# Patient Record
Sex: Female | Born: 1980 | Race: Black or African American | Hispanic: No | Marital: Single | Smoking: Former smoker
Health system: Southern US, Community
[De-identification: ages and names within clinical notes are randomized; demographics above are authoritative.]

## PROBLEM LIST (undated history)

## (undated) DIAGNOSIS — Z8669 Personal history of other diseases of the nervous system and sense organs: Secondary | ICD-10-CM

## (undated) DIAGNOSIS — Z87442 Personal history of urinary calculi: Secondary | ICD-10-CM

## (undated) HISTORY — DX: Personal history of other diseases of the nervous system and sense organs: Z86.69

---

## 2004-07-27 HISTORY — PX: TUBAL LIGATION: SHX77

## 2005-07-27 HISTORY — PX: CHOLECYSTECTOMY: SHX55

## 2011-02-22 ENCOUNTER — Emergency Department (HOSPITAL_COMMUNITY)
Admission: EM | Admit: 2011-02-22 | Discharge: 2011-02-22 | Disposition: A | Discharge status: Home / Self Care | Payer: Self-pay | Attending: Emergency Medicine | Admitting: Emergency Medicine

## 2011-02-22 DIAGNOSIS — H9209 Otalgia, unspecified ear: Secondary | ICD-10-CM | POA: Insufficient documentation

## 2011-02-22 DIAGNOSIS — J029 Acute pharyngitis, unspecified: Secondary | ICD-10-CM | POA: Insufficient documentation

## 2011-02-22 LAB — RAPID STREP SCREEN (MED CTR MEBANE ONLY): Streptococcus, Group A Screen (Direct): NEGATIVE

## 2011-09-18 ENCOUNTER — Emergency Department (INDEPENDENT_AMBULATORY_CARE_PROVIDER_SITE_OTHER)
Admission: EM | Admit: 2011-09-18 | Discharge: 2011-09-18 | Disposition: A | Discharge status: Home / Self Care | Payer: Self-pay | Source: Home / Self Care | Attending: Emergency Medicine | Admitting: Emergency Medicine

## 2011-09-18 ENCOUNTER — Encounter (HOSPITAL_COMMUNITY): Payer: Self-pay

## 2011-09-18 DIAGNOSIS — J111 Influenza due to unidentified influenza virus with other respiratory manifestations: Secondary | ICD-10-CM

## 2011-09-18 DIAGNOSIS — R6889 Other general symptoms and signs: Secondary | ICD-10-CM

## 2011-09-18 HISTORY — DX: Personal history of other diseases of the nervous system and sense organs: Z86.69

## 2011-09-18 MED ORDER — IBUPROFEN 600 MG PO TABS: 600.0000 mg | ORAL_TABLET | Freq: Four times a day (QID) | ORAL | Status: AC | PRN

## 2011-09-18 MED ORDER — FLUTICASONE PROPIONATE 50 MCG/ACT NA SUSP: 2.0000 | Freq: Every day | NASAL | Status: DC

## 2011-09-18 MED ORDER — HYDROCODONE-HOMATROPINE 5-1.5 MG/5ML PO SYRP: 5.0000 mL | ORAL_SOLUTION | Freq: Four times a day (QID) | ORAL | Status: AC | PRN

## 2011-09-18 MED ORDER — PSEUDOEPHEDRINE-GUAIFENESIN ER 120-1200 MG PO TB12: 1.0000 | ORAL_TABLET | Freq: Two times a day (BID) | ORAL | Status: DC | PRN

## 2011-09-18 NOTE — ED Provider Notes (Signed)
History     CSN: [id number]  Arrival date & time 09/18/11  1351   First MD Initiated Contact with Patient 09/18/11 1532      Chief Complaint  Patient presents with  . Influenza    (Consider location/radiation/quality/duration/timing/severity/associated sxs/prior treatment) HPI Comments: Pt with rhinorrhea, postnasal drip, ST, nonproductive cough, fatigue. bodyaches starting last night. States feels "hot" but no measured fevers at home. No ear pain, abd pain, wheeze, SOB, abd pain, rash, N/V. Slightly decreased appetite but is tolerating po.  No urinary complaints. Patient is a Engineer, civil (consulting) at [employer],  And got her flu shot this year. Has multiple sick contacts with similar symptoms.      Patient is a 31 y.o. female presenting with URI. The history is provided by the patient.  URI The primary symptoms include fatigue, headaches, sore throat, cough, nausea and myalgias. Primary symptoms do not include fever, wheezing, abdominal pain, vomiting or rash. The current episode started yesterday. This is a new problem. The problem has not changed since onset.   Past Medical History  Diagnosis Date  . Hx of migraine headaches     History reviewed. No pertinent past surgical history.  History reviewed. No pertinent family history.  History  Substance Use Topics  . Smoking status: Never Smoker   . Smokeless tobacco: Not on file  . Alcohol Use: Yes     occasional    OB History    Grav Para Term Preterm Abortions TAB SAB Ect Mult Living                  Review of Systems  Constitutional: Positive for fatigue. Negative for fever.  HENT: Positive for sore throat.   Respiratory: Positive for cough. Negative for wheezing.   Gastrointestinal: Positive for nausea. Negative for vomiting and abdominal pain.  Musculoskeletal: Positive for myalgias.  Skin: Negative for rash.  Neurological: Positive for headaches.    Allergies  Review of patient's allergies indicates no known  allergies.  Home Medications   Current Outpatient Rx  Name Route Sig Dispense Refill  . FLUTICASONE PROPIONATE 50 MCG/ACT NA SUSP Nasal Place 2 sprays into the nose daily. 16 g 0  . HYDROCODONE-HOMATROPINE 5-1.5 MG/5ML PO SYRP Oral Take 5 mLs by mouth every 6 (six) hours as needed for cough or pain. 120 mL 0  . IBUPROFEN 600 MG PO TABS Oral Take 1 tablet (600 mg total) by mouth every 6 (six) hours as needed for pain. 30 tablet 0  . PSEUDOEPHEDRINE-GUAIFENESIN ER [phone] MG PO TB12 Oral Take 1 tablet by mouth 2 (two) times daily as needed (congestion). 20 each 0    BP 130/78  Pulse 96  Temp(Src) 98.4 F (36.9 C) (Oral)  Resp 18  SpO2 100%  LMP 08/18/2011  Physical Exam  Nursing note and vitals reviewed. Constitutional: She is oriented to person, place, and time. She appears well-developed and well-nourished.  HENT:  Head: Normocephalic and atraumatic.  Right Ear: Tympanic membrane and ear canal normal.  Left Ear: Tympanic membrane and ear canal normal.  Nose: Mucosal edema and rhinorrhea present. No epistaxis.  Mouth/Throat: Uvula is midline and mucous membranes are normal. Posterior oropharyngeal erythema present. No oropharyngeal exudate.       (-) frontal, maxillary sinus tenderness  Eyes: Conjunctivae and EOM are normal.  Neck: Normal range of motion. Neck supple.  Cardiovascular: Normal rate, regular rhythm and normal heart sounds.   Pulmonary/Chest: Effort normal and breath sounds normal.  Abdominal: Soft. Bowel sounds  are normal. She exhibits no distension. There is no tenderness. There is no rebound, no guarding and no CVA tenderness.  Musculoskeletal: Normal range of motion.  Lymphadenopathy:    She has no cervical adenopathy.  Neurological: She is alert and oriented to person, place, and time.  Skin: Skin is warm and dry. No rash noted.  Psychiatric: She has a normal mood and affect. Her behavior is normal. Judgment and thought content normal.    ED Course    Procedures (including critical care time)  Labs Reviewed - No data to display No results found.   1. Flu-like symptoms       MDM  Pt appears to be in NAD. VSS. Pt non-toxic appearing. No evidence of pharyngitis or OM. No evidence of neck stiffness or other sx to support meningitis. No evidence of dehydration. Abd S/NT/ND without peritoneal sx. Doubt intraabdominal process. No evidence of PNA or UTI. Pt able to tolerate PO. Pt with viral syndrome. Will treat symptomatically and have pt f/u with PCP PRN   Luiz Blare, MD 09/18/11 1655

## 2011-09-18 NOTE — ED Notes (Addendum)
C/o  Generalized body aches, fever, chills , nasal congestion since yesterday PM, no relief w OTC medication. NAD on evaluation

## 2011-09-18 NOTE — Discharge Instructions (Signed)
Take the medication as written. Take 1 gram of tylenol with the motrin up to 4 times a day as needed for pain and fever. This is an effective combination. Drink extra fluids. Start taking the mucinex to keep the mucus secretions thin. Use a neti pot or the NeilMed sinus rinse as often as you want to to reduce nasal congestion. Follow the directions on the box. Return if you get worse, have a persistent fever >100.4, or for any concerns.     

## 2012-05-03 ENCOUNTER — Other Ambulatory Visit (HOSPITAL_COMMUNITY)
Admission: RE | Admit: 2012-05-03 | Discharge: 2012-05-03 | Disposition: A | Discharge status: Home / Self Care | Payer: 59 | Source: Ambulatory Visit | Attending: Obstetrics and Gynecology | Admitting: Obstetrics and Gynecology

## 2012-05-03 DIAGNOSIS — Z01419 Encounter for gynecological examination (general) (routine) without abnormal findings: Secondary | ICD-10-CM | POA: Insufficient documentation

## 2012-05-03 DIAGNOSIS — Z113 Encounter for screening for infections with a predominantly sexual mode of transmission: Secondary | ICD-10-CM | POA: Insufficient documentation

## 2012-07-27 LAB — HM PAP SMEAR: HM Pap smear: NORMAL

## 2013-05-27 ENCOUNTER — Emergency Department (HOSPITAL_COMMUNITY)
Admission: EM | Admit: 2013-05-27 | Discharge: 2013-05-27 | Disposition: A | Discharge status: Home / Self Care | Payer: 59 | Source: Home / Self Care

## 2013-05-27 ENCOUNTER — Encounter (HOSPITAL_COMMUNITY): Payer: Self-pay | Admitting: Emergency Medicine

## 2013-05-27 DIAGNOSIS — S29012A Strain of muscle and tendon of back wall of thorax, initial encounter: Secondary | ICD-10-CM

## 2013-05-27 DIAGNOSIS — S239XXA Sprain of unspecified parts of thorax, initial encounter: Secondary | ICD-10-CM

## 2013-05-27 DIAGNOSIS — T148XXA Other injury of unspecified body region, initial encounter: Secondary | ICD-10-CM

## 2013-05-27 LAB — POCT URINALYSIS DIP (DEVICE)
Bilirubin Urine: NEGATIVE
Glucose, UA: NEGATIVE mg/dL
Hgb urine dipstick: NEGATIVE
Ketones, ur: NEGATIVE mg/dL
Nitrite: NEGATIVE
Protein, ur: NEGATIVE mg/dL
Specific Gravity, Urine: 1.02 (ref 1.005–1.030)
Urobilinogen, UA: 0.2 mg/dL (ref 0.0–1.0)
pH: 5.5 (ref 5.0–8.0)

## 2013-05-27 LAB — POCT PREGNANCY, URINE: Preg Test, Ur: NEGATIVE

## 2013-05-27 MED ORDER — DICLOFENAC POTASSIUM 50 MG PO TABS: 50.0000 mg | ORAL_TABLET | Freq: Three times a day (TID) | ORAL | Status: DC

## 2013-05-27 MED ORDER — TRAMADOL HCL 50 MG PO TABS: 50.0000 mg | ORAL_TABLET | Freq: Four times a day (QID) | ORAL | Status: DC | PRN

## 2013-05-27 NOTE — ED Notes (Signed)
Pt c/o back pain onset Tuesday Reports she was raking leaves on Monday and on Tuesday am she woke up with pain Pain is constant and gradually getting worse... Increases when she bends or breaths Denies: urinary sxs, gyn sxs, inj/trauma She is alert w/no signs of acute distress... Ambulated to exam room w/NAD

## 2013-05-27 NOTE — ED Provider Notes (Signed)
CSN: [id number]     Arrival date & time 05/27/13  1830 History   First MD Initiated Contact with Patient 05/27/13 1913     Chief Complaint  Patient presents with  . Back Pain   (Consider location/radiation/quality/duration/timing/severity/associated sxs/prior Treatment) HPI Comments: 32 year old morbidly obese female presents with left upper back pain this started one to 2 days after raking leaves. The pain is located primarily to the left posterior shoulder, trapezius and latissimus. Pain is worse with certain movements particularly of the arm and shoulder. Denies spinal pain or focal paresthesias or weakness.   Past Medical History  Diagnosis Date  . Hx of migraine headaches    Past Surgical History  Procedure Laterality Date  . Cesarean section    . Cholecystectomy     No family history on file. History  Substance Use Topics  . Smoking status: Never Smoker   . Smokeless tobacco: Not on file  . Alcohol Use: Yes     Comment: occasional   OB History   Grav Para Term Preterm Abortions TAB SAB Ect Mult Living                 Review of Systems  Constitutional: Negative for fever, chills and activity change.  HENT: Negative.   Respiratory: Negative.   Cardiovascular: Negative.   Musculoskeletal:       As per HPI  Skin: Negative for color change, pallor and rash.  Neurological: Negative.     Allergies  Review of patient's allergies indicates no known allergies.  Home Medications   Current Outpatient Rx  Name  Route  Sig  Dispense  Refill  . diclofenac (CATAFLAM) 50 MG tablet   Oral   Take 1 tablet (50 mg total) by mouth 3 (three) times daily.   21 tablet   0   . EXPIRED: fluticasone (FLONASE) 50 MCG/ACT nasal spray   Nasal   Place 2 sprays into the nose daily.   16 g   0   . Pseudoephedrine-Guaifenesin (MUCINEX D) [phone] MG TB12   Oral   Take 1 tablet by mouth 2 (two) times daily as needed (congestion).   20 each   0   . traMADol (ULTRAM) 50 MG  tablet   Oral   Take 1 tablet (50 mg total) by mouth every 6 (six) hours as needed for pain.   20 tablet   0    BP 145/92  Pulse 54  Temp(Src) 98.1 F (36.7 C) (Oral)  Resp 18  SpO2 100%  LMP 04/30/2013 Physical Exam  Nursing note and vitals reviewed. Constitutional: She is oriented to person, place, and time. She appears well-developed and well-nourished. No distress.  HENT:  Head: Normocephalic and atraumatic.  Eyes: EOM are normal. Pupils are equal, round, and reactive to light.  Neck: Normal range of motion. Neck supple.  Musculoskeletal: She exhibits tenderness. She exhibits no edema.  Tenderness over the muscles of the left upper back. No swelling or deformity. Pain produced with raising arms overhead or pulling on the back.  Lymphadenopathy:    She has no cervical adenopathy.  Neurological: She is alert and oriented to person, place, and time. No cranial nerve deficit.  Skin: Skin is warm and dry.    ED Course  Procedures (including critical care time) Labs Review Labs Reviewed  POCT URINALYSIS DIP (DEVICE) - Abnormal; Notable for the following:    Leukocytes, UA TRACE (*)    All other components within normal limits  POCT PREGNANCY, URINE  Imaging Review No results found.    MDM   1. Upper back strain, initial encounter   2. Muscle strain      No raking leaves or other similar movements to make it worse Cont yuour muscle relaxants Add cataflam and ultram for pain. Cont with heat   Hayden Rasmussen, NP 05/27/13 1946

## 2013-05-27 NOTE — ED Provider Notes (Signed)
Medical screening examination/treatment/procedure(s) were performed by non-physician practitioner and as supervising physician I was immediately available for consultation/collaboration.  Teandre Hamre, M.D.  Angus Amini C Kyndall Chaplin, MD 05/27/13 1954 

## 2013-08-09 ENCOUNTER — Ambulatory Visit (INDEPENDENT_AMBULATORY_CARE_PROVIDER_SITE_OTHER): Payer: 59 | Admitting: Physician Assistant

## 2013-08-09 ENCOUNTER — Encounter: Payer: Self-pay | Admitting: Physician Assistant

## 2013-08-09 VITALS — BP 122/82 | HR 64 | Temp 98.9°F | Ht 65.0 in | Wt 276.4 lb

## 2013-08-09 DIAGNOSIS — Z0000 Encounter for general adult medical examination without abnormal findings: Secondary | ICD-10-CM

## 2013-08-09 DIAGNOSIS — E669 Obesity, unspecified: Secondary | ICD-10-CM

## 2013-08-09 NOTE — Patient Instructions (Signed)
It was great to meet you today Dawn Young!  I have ordered your labs. Please arrive fasting prior to lab draw.  I have placed referral to GYN for you as well.     Reading Food Labels Foods that are in packaging or containers will often have a Nutrition Facts panel on its side or back. The Nutrition Facts panel provides the nutritional value of the food. This information is helpful when determining healthy food choices. By reading food labels, you will find out the serving size of a food and how many servings the package has. You will also find information about the calorie and fat content, as well as the amount of carbohydrate, and vitamins and minerals. Food labels are a great reference for you to use to learn about the food you are eating. BREAKING DOWN THE FOOD LABEL Serving Size: The serving size is an amount of food and is often listed in cups, weight, or units. All of the nutrition information about the food is listed according to the serving size. If you double the serving size, you must double the amounts on the label.  Servings per [employer] or Package: The number of servings in the container is listed here.  Calories: The number of calories in one serving is listed here. Everyone needs a different amount of calories each day. Having calories listed on the label is helpful information for people who would like to keep track of the number of calories they eat to stay at a healthy weight. Calories from Fat:  The number of calories that come from fat in one serving are listed here.  NUTRIENTS THAT ARE LISTED ON THE FOOD LABEL.   Percent Daily Value: The food label helps you know if you are getting the amounts of nutrients you need each day by the percent daily value. It tells you how much of your daily values of each nutrient are provided by one serving of the food. The percent daily value is based on a 2000 calorie diet. You may need more or less than 2000 calories each day.  Total Fat:  The total amount of fat in one serving is listed here. The number is shown in grams (g). This information is important for people who want to keep track of the amount of fat in their diet. Foods with high amounts of fat usually have higher calories and may lead to weight gain.  Saturated Fat:  The amount of saturated fat in one serving is listed here. It is also shown in grams. Saturated fat is one type of fat that is found in food. It increases the amount of blood cholesterol more than other types of fat found in food. So saturated fat should be limited in the diet to less than 7 percent of total calories each day for most people. This means that if a person eats 2000 calories each day, they should eat less than 140 calories from saturated fat.  Trans Fat: The amount of trans fat in one serving is listed here. It is also shown in grams. Trans fat is another type of fat that is found in food. It should also be limited to less than 2 grams per day because it increases blood cholesterol.  Cholesterol: The amount of cholesterol in one serving is listed here. It is shown in milligrams (mg). Cholesterol should be limited to no more than 200 mg each day.  Sodium: The amount of sodium in one serving is listed here. It is shown  in milligrams. American Heart Association recommends that sodium should be limited to <1559m/day. This recommended level of sodium was recently lowered from 24032mday.  Total Carbohydrate: The amount of carbohydrate in one serving is listed here. It is shown in grams. This information is important for people with diabetes because they need to manage the amount of carbohydrate they eat. Carbohydrate changes the amount of glucose or sugar in the blood and diabetics do not want that amount to be too high or too low.  Dietary Fiber:  The amount of dietary fiber in one serving is listed here. It is shown in grams. Fiber is a type of carbohydrate. Most people should eat 25 grams of dietary  fiber each day.  Sugars: The amount of sugar in one serving is listed here. It is shown in grams. Sugars are also a type of carbohydrate. This value includes both naturally occurring sugars from fruit and milk and added sugars such as honey or table sugar.  Protein: The amount of protein in one serving is listed here. It is shown in grams.  Vitamins and Minerals: Food labels list vitamin A, vitamin C, calcium and iron. They are all shown as a percent of the daily need one serving of the food provides. For example, if 15% is listed next to iron it means that one serving of that food will give you 15% of the total amount of iron you need for one day.  Calories per Gram: Some food labels will list the number of calories that are in each gram or protein, carbohydrate and fat. Protein has four calories per gram, carbohydrate has four calories per gram, and fat has 9 calories per gram.  Ingredients: Food labels will list each ingredient in the food. The first ingredient listed is the ingredient that the food has the most of. The ingredients are listed in the order of their amount from highest to lowest.  Contains: Food labels may also include this portion of the label as a food allergen warning. Listed here are ingredients that can cause allergies in some people. Examples of ingredients that are listed are wheat, dairy, eggs, soy and nuts. If a person knows that are allergic to one of these ingredients they will know not to eat the food in the container. Information from www.eatright.orSamuel Boucheutritional Analysis Database, ADA Nutrition Care Manual. Document Released: 07/13/2005 Document Revised: 10/05/2011 Document Reviewed: 11/26/2008 [facility] Information 2014 [place]  Health Maintenance, Female A healthy lifestyle and preventative care can promote health and wellness.  Maintain regular health, dental, and eye exams.  Eat a healthy diet. Foods like vegetables, fruits, whole  grains, low-fat dairy products, and lean protein foods contain the nutrients you need without too many calories. Decrease your intake of foods high in solid fats, added sugars, and salt. Get information about a proper diet from your caregiver, if necessary.  Regular physical exercise is one of the most important things you can do for your health. Most adults should get at least 150 minutes of moderate-intensity exercise (any activity that increases your heart rate and causes you to sweat) each week. In addition, most adults need muscle-strengthening exercises on 2 or more days a week.   Maintain a healthy weight. The body mass index (BMI) is a screening tool to identify possible weight problems. It provides an estimate of body fat based on height and weight. Your caregiver can help determine your BMI, and can help you achieve or maintain a healthy weight. For  adults 20 years and older:  A BMI below 18.5 is considered underweight.  A BMI of 18.5 to 24.9 is normal.  A BMI of 25 to 29.9 is considered overweight.  A BMI of 30 and above is considered obese.  Maintain normal blood lipids and cholesterol by exercising and minimizing your intake of saturated fat. Eat a balanced diet with plenty of fruits and vegetables. Blood tests for lipids and cholesterol should begin at age 36 and be repeated every 5 years. If your lipid or cholesterol levels are high, you are over 50, or you are a high risk for heart disease, you may need your cholesterol levels checked more frequently.Ongoing high lipid and cholesterol levels should be treated with medicines if diet and exercise are not effective.  If you smoke, find out from your caregiver how to quit. If you do not use tobacco, do not start.  Lung cancer screening is recommended for adults aged 13 80 years who are at high risk for developing lung cancer because of a history of smoking. Yearly low-dose computed tomography (CT) is recommended for people who have at  least a 30-pack-year history of smoking and are a current smoker or have quit within the past 15 years. A pack year of smoking is smoking an average of 1 pack of cigarettes a day for 1 year (for example: 1 pack a day for 30 years or 2 packs a day for 15 years). Yearly screening should continue until the smoker has stopped smoking for at least 15 years. Yearly screening should also be stopped for people who develop a health problem that would prevent them from having lung cancer treatment.  If you are pregnant, do not drink alcohol. If you are breastfeeding, be very cautious about drinking alcohol. If you are not pregnant and choose to drink alcohol, do not exceed 1 drink per day. One drink is considered to be 12 ounces (355 mL) of beer, 5 ounces (148 mL) of wine, or 1.5 ounces (44 mL) of liquor.  Avoid use of street drugs. Do not share needles with anyone. Ask for help if you need support or instructions about stopping the use of drugs.  High blood pressure causes heart disease and increases the risk of stroke. Blood pressure should be checked at least every 1 to 2 years. Ongoing high blood pressure should be treated with medicines, if weight loss and exercise are not effective.  If you are 28 to 31 years old, ask your caregiver if you should take aspirin to prevent strokes.  Diabetes screening involves taking a blood sample to check your fasting blood sugar level. This should be done once every 3 years, after age 15, if you are within normal weight and without risk factors for diabetes. Testing should be considered at a younger age or be carried out more frequently if you are overweight and have at least 1 risk factor for diabetes.  Breast cancer screening is essential preventative care for women. You should practice "breast self-awareness." This means understanding the normal appearance and feel of your breasts and may include breast self-examination. Any changes detected, no matter how small, should  be reported to a caregiver. Women in their 32s and 30s should have a clinical breast exam (CBE) by a caregiver as part of a regular health exam every 1 to 3 years. After age 67, women should have a CBE every year. Starting at age 54, women should consider having a mammogram (breast X-ray) every year. Women who have  a family history of breast cancer should talk to their caregiver about genetic screening. Women at a high risk of breast cancer should talk to their caregiver about having an MRI and a mammogram every year.  Breast cancer gene (BRCA)-related cancer risk assessment is recommended for women who have family members with BRCA-related cancers. BRCA-related cancers include breast, ovarian, tubal, and peritoneal cancers. Having family members with these cancers may be associated with an increased risk for harmful changes (mutations) in the breast cancer genes BRCA1 and BRCA2. Results of the assessment will determine the need for genetic counseling and BRCA1 and BRCA2 testing.  The Pap test is a screening test for cervical cancer. Women should have a Pap test starting at age 96. Between ages 58 and 80, Pap tests should be repeated every 2 years. Beginning at age 19, you should have a Pap test every 3 years as long as the past 3 Pap tests have been normal. If you had a hysterectomy for a problem that was not cancer or a condition that could lead to cancer, then you no longer need Pap tests. If you are between ages 12 and 41, and you have had normal Pap tests going back 10 years, you no longer need Pap tests. If you have had past treatment for cervical cancer or a condition that could lead to cancer, you need Pap tests and screening for cancer for at least 20 years after your treatment. If Pap tests have been discontinued, risk factors (such as a new sexual partner) need to be reassessed to determine if screening should be resumed. Some women have medical problems that increase the chance of getting cervical  cancer. In these cases, your caregiver may recommend more frequent screening and Pap tests.  The human papillomavirus (HPV) test is an additional test that may be used for cervical cancer screening. The HPV test looks for the virus that can cause the cell changes on the cervix. The cells collected during the Pap test can be tested for HPV. The HPV test could be used to screen women aged 32 years and older, and should be used in women of any age who have unclear Pap test results. After the age of 5, women should have HPV testing at the same frequency as a Pap test.  Colorectal cancer can be detected and often prevented. Most routine colorectal cancer screening begins at the age of 82 and continues through age 81. However, your caregiver may recommend screening at an earlier age if you have risk factors for colon cancer. On a yearly basis, your caregiver may provide home test kits to check for hidden blood in the stool. Use of a small camera at the end of a tube, to directly examine the colon (sigmoidoscopy or colonoscopy), can detect the earliest forms of colorectal cancer. Talk to your caregiver about this at age 65, when routine screening begins. Direct examination of the colon should be repeated every 5 to 10 years through age 56, unless early forms of pre-cancerous polyps or small growths are found.  Hepatitis C blood testing is recommended for all people born from [birth year] through 1963 and any individual with known risks for hepatitis C.  Practice safe sex. Use condoms and avoid high-risk sexual practices to reduce the spread of sexually transmitted infections (STIs). Sexually active women aged 33 and younger should be checked for Chlamydia, which is a common sexually transmitted infection. Older women with new or multiple partners should also be tested for Chlamydia. Testing for  other STIs is recommended if you are sexually active and at increased risk.  Osteoporosis is a disease in which the bones lose  minerals and strength with aging. This can result in serious bone fractures. The risk of osteoporosis can be identified using a bone density scan. Women ages 83 and over and women at risk for fractures or osteoporosis should discuss screening with their caregivers. Ask your caregiver whether you should be taking a calcium supplement or vitamin D to reduce the rate of osteoporosis.  Menopause can be associated with physical symptoms and risks. Hormone replacement therapy is available to decrease symptoms and risks. You should talk to your caregiver about whether hormone replacement therapy is right for you.  Use sunscreen. Apply sunscreen liberally and repeatedly throughout the day. You should seek shade when your shadow is shorter than you. Protect yourself by wearing long sleeves, pants, a wide-brimmed hat, and sunglasses year round, whenever you are outdoors.  Notify your caregiver of new moles or changes in moles, especially if there is a change in shape or color. Also notify your caregiver if a mole is larger than the size of a pencil eraser.  Stay current with your immunizations. Document Released: 01/26/2011 Document Revised: 11/07/2012 Document Reviewed: 01/26/2011 [facility] [place] Patient Information 2014 [place].

## 2013-08-09 NOTE — Progress Notes (Signed)
Subjective:    Patient ID: Dawn Young, female    DOB: February 07, 1981, 33 y.o.   MRN: [id number]  HPI Comments: Patient is a 33 year old female who presents to the office today to establish care and have yearly physical exam. Patient states she has no concerns today. Denies past diagnosis of chronic medical conditions. Is not currently taking any medications. Reports gets and occasional headache which responds well to OTC meds or rest.  Denies any recent fevers, eye pain, change in vision or visual disturbances. Denies change in bowel/bladder habits, chest pain, shortness of breath, cough or wheezing.      Review of Systems  Constitutional: Negative for activity change and appetite change.  HENT: Negative for dental problem and trouble swallowing.   Respiratory: Negative for cough, chest tightness and shortness of breath.   Cardiovascular: Negative for chest pain, palpitations and leg swelling.  Neurological: Negative for weakness, light-headedness and numbness. Headaches: off and on, respond to OTC medications or rest.  All other systems reviewed and are negative.       Objective:   Physical Exam  Vitals reviewed. Constitutional: She is oriented to person, place, and time. She appears well-developed and well-nourished. No distress.  HENT:  Head: Normocephalic and atraumatic.  Right Ear: Hearing, tympanic membrane, external ear and ear canal normal.  Left Ear: Hearing, tympanic membrane, external ear and ear canal normal.  Nose: Nose normal.  Mouth/Throat: Uvula is midline, oropharynx is clear and moist and mucous membranes are normal.  Eyes: EOM are normal. Pupils are equal, round, and reactive to light.  Neck: Normal range of motion. Neck supple. No thyromegaly present.  Cardiovascular: Normal rate, regular rhythm and normal heart sounds.  Exam reveals no gallop.   No murmur heard. Pulses:      Radial pulses are 2+ on the right side, and 2+ on the left side.       Dorsalis  pedis pulses are 2+ on the right side, and 2+ on the left side.  Pulmonary/Chest: Effort normal and breath sounds normal. She has no wheezes. She has no rhonchi. She has no rales.  Abdominal: Soft. Bowel sounds are normal. She exhibits no distension. There is no hepatosplenomegaly. There is no tenderness.  obese  Genitourinary:  Deferred to GYN  Musculoskeletal: Normal range of motion.  FROM U/LE bilateral  Lymphadenopathy:    She has no cervical adenopathy.    She has no axillary adenopathy.  Neurological: She is alert and oriented to person, place, and time. She has normal strength and normal reflexes. No cranial nerve deficit. Coordination and gait normal.  Reflex Scores:      Bicep reflexes are 2+ on the right side and 2+ on the left side.      Patellar reflexes are 2+ on the right side and 2+ on the left side. Skin: Skin is warm and dry. She is not diaphoretic.  Psychiatric: She has a normal mood and affect. Her speech is normal and behavior is normal.   BP 122/82  Pulse 64  Temp(Src) 98.9 F (37.2 C) (Oral)  Ht 5\' 5"  (1.651 m)  Wt 276 lb 6.4 oz (125.374 kg)  BMI 46.00 kg/m2  SpO2 98%  Past Medical History  Diagnosis Date  . Hx of migraine headaches   . Hx of migraines   . Headache    Family History  Problem Relation Age of Onset  . Family history unknown: Yes   History   Social History  . Marital  Status: Single    Spouse Name: N/A    Number of Children: N/A  . Years of Education: N/A   Social History Main Topics  . Smoking status: Never Smoker   . Smokeless tobacco: None  . Alcohol Use: Yes     Comment: occasional  . Drug Use: No  . Sexual Activity: Yes   Other Topics Concern  . None   Social History Narrative  . None       Assessment & Plan:    CPX/v70.0 - Patient has been counseled on age-appropriate routine health concerns for screening and prevention. These are reviewed and up-to-date. Immunizations are up-to-date as far as patient is aware.  She will verify with employer. Labs ordered and will be reviewed.  Discussed weight management with patient. Provided food label reading education. Discuss healthy choices and lifestyle/exercise choices.

## 2013-08-09 NOTE — Progress Notes (Signed)
Pre-visit discussion using our clinic review tool. No additional management support is needed unless otherwise documented below in the visit note.  

## 2013-08-11 ENCOUNTER — Encounter: Payer: Self-pay | Admitting: Medical

## 2013-08-12 NOTE — Assessment & Plan Note (Signed)
Discussed weight management with patient. Provided food label reading education. Discuss healthy choices and lifestyle/exercise choices.

## 2013-09-22 ENCOUNTER — Encounter: Payer: Self-pay | Admitting: Physician Assistant

## 2013-09-22 DIAGNOSIS — N62 Hypertrophy of breast: Secondary | ICD-10-CM

## 2013-09-29 ENCOUNTER — Encounter: Payer: 59 | Admitting: Medical

## 2014-05-21 ENCOUNTER — Encounter: Payer: Self-pay | Admitting: Gynecology

## 2014-05-21 ENCOUNTER — Ambulatory Visit (INDEPENDENT_AMBULATORY_CARE_PROVIDER_SITE_OTHER): Payer: 59 | Admitting: Gynecology

## 2014-05-21 ENCOUNTER — Encounter: Payer: Self-pay | Admitting: Physician Assistant

## 2014-05-21 ENCOUNTER — Other Ambulatory Visit (HOSPITAL_COMMUNITY)
Admission: RE | Admit: 2014-05-21 | Discharge: 2014-05-21 | Disposition: A | Discharge status: Home / Self Care | Payer: 59 | Source: Ambulatory Visit | Attending: Gynecology | Admitting: Gynecology

## 2014-05-21 VITALS — BP 132/88 | Ht 65.0 in | Wt 268.0 lb

## 2014-05-21 DIAGNOSIS — Z1151 Encounter for screening for human papillomavirus (HPV): Secondary | ICD-10-CM | POA: Insufficient documentation

## 2014-05-21 DIAGNOSIS — Z113 Encounter for screening for infections with a predominantly sexual mode of transmission: Secondary | ICD-10-CM

## 2014-05-21 DIAGNOSIS — Z01419 Encounter for gynecological examination (general) (routine) without abnormal findings: Secondary | ICD-10-CM | POA: Diagnosis present

## 2014-05-21 DIAGNOSIS — N9089 Other specified noninflammatory disorders of vulva and perineum: Secondary | ICD-10-CM

## 2014-05-21 DIAGNOSIS — A6009 Herpesviral infection of other urogenital tract: Secondary | ICD-10-CM

## 2014-05-21 DIAGNOSIS — N915 Oligomenorrhea, unspecified: Secondary | ICD-10-CM

## 2014-05-21 DIAGNOSIS — L298 Other pruritus: Secondary | ICD-10-CM

## 2014-05-21 DIAGNOSIS — N898 Other specified noninflammatory disorders of vagina: Secondary | ICD-10-CM

## 2014-05-21 DIAGNOSIS — A609 Anogenital herpesviral infection, unspecified: Secondary | ICD-10-CM

## 2014-05-21 LAB — LIPID PANEL
Cholesterol: 146 mg/dL (ref 0–200)
HDL: 35 mg/dL — ABNORMAL LOW (ref >39)
LDL Cholesterol: 97 mg/dL (ref 0–99)
Total CHOL/HDL Ratio: 4.2 Ratio
Triglycerides: 68 mg/dL (ref <150)
VLDL: 14 mg/dL (ref 0–40)

## 2014-05-21 LAB — RPR

## 2014-05-21 LAB — COMPREHENSIVE METABOLIC PANEL
ALT: 14 U/L (ref 0–35)
AST: 13 U/L (ref 0–37)
Albumin: 4 g/dL (ref 3.5–5.2)
Alkaline Phosphatase: 67 U/L (ref 39–117)
BUN: 10 mg/dL (ref 6–23)
CO2: 27 mEq/L (ref 19–32)
Calcium: 9.5 mg/dL (ref 8.4–10.5)
Chloride: 104 mEq/L (ref 96–112)
Creat: 0.75 mg/dL (ref 0.50–1.10)
Glucose, Bld: 90 mg/dL (ref 70–99)
Potassium: 4.7 mEq/L (ref 3.5–5.3)
Sodium: 139 mEq/L (ref 135–145)
Total Bilirubin: 0.3 mg/dL (ref 0.2–1.2)
Total Protein: 7.6 g/dL (ref 6.0–8.3)

## 2014-05-21 LAB — CBC WITH DIFFERENTIAL/PLATELET
Basophils Absolute: 0.1 10*3/uL (ref 0.0–0.1)
Basophils Relative: 1 % (ref 0–1)
Eosinophils Absolute: 0.1 10*3/uL (ref 0.0–0.7)
Eosinophils Relative: 1 % (ref 0–5)
HCT: 30.5 % — ABNORMAL LOW (ref 36.0–46.0)
Hemoglobin: 10.1 g/dL — ABNORMAL LOW (ref 12.0–15.0)
Lymphocytes Relative: 29 % (ref 12–46)
Lymphs Abs: 1.6 10*3/uL (ref 0.7–4.0)
MCH: 24 pg — ABNORMAL LOW (ref 26.0–34.0)
MCHC: 33.1 g/dL (ref 30.0–36.0)
MCV: 72.6 fL — ABNORMAL LOW (ref 78.0–100.0)
Monocytes Absolute: 0.6 10*3/uL (ref 0.1–1.0)
Monocytes Relative: 12 % (ref 3–12)
Neutro Abs: 3.1 10*3/uL (ref 1.7–7.7)
Neutrophils Relative %: 57 % (ref 43–77)
Platelets: 284 10*3/uL (ref 150–400)
RBC: 4.2 MIL/uL (ref 3.87–5.11)
RDW: 16.4 % — ABNORMAL HIGH (ref 11.5–15.5)
WBC: 5.4 10*3/uL (ref 4.0–10.5)

## 2014-05-21 LAB — WET PREP FOR TRICH, YEAST, CLUE
Trich, Wet Prep: NONE SEEN
Yeast Wet Prep HPF POC: NONE SEEN

## 2014-05-21 LAB — HEPATITIS C ANTIBODY: HCV Ab: NEGATIVE

## 2014-05-21 LAB — HEPATITIS B SURFACE ANTIGEN: Hepatitis B Surface Ag: NEGATIVE

## 2014-05-21 LAB — TSH: TSH: 0.758 u[IU]/mL (ref 0.350–4.500)

## 2014-05-21 LAB — HIV ANTIBODY (ROUTINE TESTING W REFLEX): HIV 1&2 Ab, 4th Generation: NONREACTIVE

## 2014-05-21 MED ORDER — TINIDAZOLE 500 MG PO TABS: ORAL_TABLET | ORAL | Status: DC

## 2014-05-21 MED ORDER — MEDROXYPROGESTERONE ACETATE 10 MG PO TABS: ORAL_TABLET | ORAL | Status: DC

## 2014-05-21 MED ORDER — VALACYCLOVIR HCL 1 G PO TABS: 1000.0000 mg | ORAL_TABLET | Freq: Two times a day (BID) | ORAL | Status: DC

## 2014-05-21 NOTE — Patient Instructions (Addendum)
Genital Herpes Genital herpes is a sexually transmitted disease. This means that it is a disease passed by having sex with an infected person. There is no cure for genital herpes. The time between attacks can be months to years. The virus may live in a person but produce no problems (symptoms). This infection can be passed to a baby as it travels down the birth canal (vagina). In a newborn, this can cause central nervous system damage, eye damage, or even death. The virus that causes genital herpes is usually HSV-2 virus. The virus that causes oral herpes is usually HSV-1. The diagnosis (learning what is wrong) is made through culture results. SYMPTOMS  Usually symptoms of pain and itching begin a few days to a week after contact. It first appears as small blisters that progress to small painful ulcers which then scab over and heal after several days. It affects the outer genitalia, birth canal, cervix, penis, anal area, buttocks, and thighs. HOME CARE INSTRUCTIONS   Keep ulcerated areas dry and clean.  Take medications as directed. Antiviral medications can speed up healing. They will not prevent recurrences or cure this infection. These medications can also be taken for suppression if there are frequent recurrences.  While the infection is active, it is contagious. Avoid all sexual contact during active infections.  Condoms may help prevent spread of the herpes virus.  Practice safe sex.  Wash your hands thoroughly after touching the genital area.  Avoid touching your eyes after touching your genital area.  Inform your caregiver if you have had genital herpes and become pregnant. It is your responsibility to insure a safe outcome for your baby in this pregnancy.  Only take over-the-counter or prescription medicines for pain, discomfort, or fever as directed by your caregiver. SEEK MEDICAL CARE IF:   You have a recurrence of this infection.  You do not respond to medications and are not  improving.  You have new sources of pain or discharge which have changed from the original infection.  You have an oral temperature above 102 F (38.9 C).  You develop abdominal pain.  You develop eye pain or signs of eye infection. Document Released: 07/10/2000 Document Revised: 10/05/2011 Document Reviewed: 07/31/2009 [facility] Patient Information 2015 [place], [place]. This information is not intended to replace advice given to you by your health care provider. Make sure you discuss any questions you have with your health care provider. Valacyclovir caplets What is this medicine? VALACYCLOVIR (val ay SYE kloe veer) is an antiviral medicine. It is used to treat or prevent infections caused by certain kinds of viruses. Examples of these infections include herpes and shingles. This medicine will not cure herpes. This medicine may be used for other purposes; ask your health care provider or pharmacist if you have questions. COMMON BRAND NAME(S): Valtrex What should I tell my health care provider before I take this medicine? They need to know if you have any of these conditions: -acquired immunodeficiency syndrome (AIDS) -any other condition that may weaken the immune system -bone marrow or kidney transplant -kidney disease -an unusual or allergic reaction to valacyclovir, acyclovir, ganciclovir, valganciclovir, other medicines, foods, dyes, or preservatives -pregnant or trying to get pregnant -breast-feeding How should I use this medicine? Take this medicine by mouth with a glass of water. Follow the directions on the prescription label. You can take this medicine with or without food. Take your doses at regular intervals. Do not take your medicine more often than directed. Finish the full course  prescribed by your doctor or health care professional even if you think your condition is better. Do not stop taking except on the advice of your doctor or health care professional. Talk to your  pediatrician regarding the use of this medicine in children. While this drug may be prescribed for children as young as 2 years for selected conditions, precautions do apply. Overdosage: If you think you have taken too much of this medicine contact a poison control center or emergency room at once. NOTE: This medicine is only for you. Do not share this medicine with others. What if I miss a dose? If you miss a dose, take it as soon as you can. If it is almost time for your next dose, take only that dose. Do not take double or extra doses. What may interact with this medicine? -cimetidine -probenecid This list may not describe all possible interactions. Give your health care provider a list of all the medicines, herbs, non-prescription drugs, or dietary supplements you use. Also tell them if you smoke, drink alcohol, or use illegal drugs. Some items may interact with your medicine. What should I watch for while using this medicine? Tell your doctor or health care professional if your symptoms do not start to get better after 1 week. This medicine works best when taken early in the course of an infection, within the first 55 hours. Begin treatment as soon as possible after the first signs of infection like tingling, itching, or pain in the affected area. It is possible that genital herpes may still be spread even when you are not having symptoms. Always use safer sex practices like condoms made of latex or polyurethane whenever you have sexual contact. You should stay well hydrated while taking this medicine. Drink plenty of fluids. What side effects may I notice from receiving this medicine? Side effects that you should report to your doctor or health care professional as soon as possible: -allergic reactions like skin rash, itching or hives, swelling of the face, lips, or tongue -aggressive behavior -confusion -hallucinations -problems with balance, talking, walking -stomach  pain -tremor -trouble passing urine or change in the amount of urine Side effects that usually do not require medical attention (report to your doctor or health care professional if they continue or are bothersome): -dizziness -headache -nausea, vomiting This list may not describe all possible side effects. Call your doctor for medical advice about side effects. You may report side effects to FDA at 1-800-FDA-1088. Where should I keep my medicine? Keep out of the reach of children. Store at room temperature between 15 and 25 degrees C (59 and 77 degrees F). Keep container tightly closed. Throw away any unused medicine after the expiration date. NOTE: This sheet is a summary. It may not cover all possible information. If you have questions about this medicine, talk to your doctor, pharmacist, or health care provider.  2015, Elsevier/Gold Standard. (2012-06-28 16:34:05) Tinidazole tablets What is this medicine? TINIDAZOLE (tye NI da zole) is an antiinfective. It is used to treat amebiasis, giardiasis, trichomoniasis, and vaginosis. It will not work for colds, flu, or other viral infections. This medicine may be used for other purposes; ask your health care provider or pharmacist if you have questions. COMMON BRAND NAME(S): Tindamax What should I tell my health care provider before I take this medicine? They need to know if you have any of these conditions: -anemia or other blood disorders -if you frequently drink alcohol containing drinks -receiving hemodialysis -seizure disorder -an unusual  or allergic reaction to tinidazole, other medicines, foods, dyes, or preservatives -pregnant or trying to get pregnant -breast-feeding How should I use this medicine? Take this medicine by mouth with a full glass of water. Follow the directions on the prescription label. Take with food. Take your medicine at regular intervals. Do not take your medicine more often than directed. Take all of your  medicine as directed even if you think you are better. Do not skip doses or stop your medicine early. Talk to your pediatrician regarding the use of this medicine in children. While this drug may be prescribed for children as young as 41 years of age for selected conditions, precautions do apply. Overdosage: If you think you have taken too much of this medicine contact a poison control center or emergency room at once. NOTE: This medicine is only for you. Do not share this medicine with others. What if I miss a dose? If you miss a dose, take it as soon as you can. If it is almost time for your next dose, take only that dose. Do not take double or extra doses. What may interact with this medicine? Do not take this medicine with any of the following medications: -alcohol or any product that contains alcohol -amprenavir oral solution -disulfiram -paclitaxel injection -ritonavir oral solution -sertraline oral solution -sulfamethoxazole-trimethoprim injection This medicine may also interact with the following medications: -cholestyramine -cimetidine -conivaptan -cyclosporin -fluorouracil -fosphenytoin, phenytoin -ketoconazole -lithium -phenobarbital -tacrolimus -warfarin This list may not describe all possible interactions. Give your health care provider a list of all the medicines, herbs, non-prescription drugs, or dietary supplements you use. Also tell them if you smoke, drink alcohol, or use illegal drugs. Some items may interact with your medicine. What should I watch for while using this medicine? Tell your doctor or health care professional if your symptoms do not improve or if they get worse. Avoid alcoholic drinks while you are taking this medicine and for three days afterward. Alcohol may make you feel dizzy, sick, or flushed. If you are being treated for a sexually transmitted disease, avoid sexual contact until you have finished your treatment. Your sexual partner may also need  treatment. What side effects may I notice from receiving this medicine? Side effects that you should report to your doctor or health care professional as soon as possible: -allergic reactions like skin rash, itching or hives, swelling of the face, lips, or tongue -breathing problems -confusion, depression -dark or white patches in the mouth -feeling faint or lightheaded, falls -fever, infection -numbness, tingling, pain or weakness in the hands or feet -pain when passing urine -seizures -unusually weak or tired -vaginal irritation or discharge -vomiting Side effects that usually do not require medical attention (report to your doctor or health care professional if they continue or are bothersome): -dark brown or reddish urine -diarrhea -headache -loss of appetite -metallic taste -nausea -stomach upset This list may not describe all possible side effects. Call your doctor for medical advice about side effects. You may report side effects to FDA at 1-800-FDA-1088. Where should I keep my medicine? Keep out of the reach of children. Store at room temperature between 15 and 30 degrees C (59 and 86 degrees F). Protect from light and moisture. Keep container tightly closed. Throw away any unused medicine after the expiration date. NOTE: This sheet is a summary. It may not cover all possible information. If you have questions about this medicine, talk to your doctor, pharmacist, or health care provider.  2015, Elsevier/Gold  Standard. (2008-04-09 15:22:28) Bacterial Vaginosis Bacterial vaginosis is a vaginal infection that occurs when the normal balance of bacteria in the vagina is disrupted. It results from an overgrowth of certain bacteria. This is the most common vaginal infection in women of childbearing age. Treatment is important to prevent complications, especially in pregnant women, as it can cause a premature delivery. CAUSES  Bacterial vaginosis is caused by an increase in harmful  bacteria that are normally present in smaller amounts in the vagina. Several different kinds of bacteria can cause bacterial vaginosis. However, the reason that the condition develops is not fully understood. RISK FACTORS Certain activities or behaviors can put you at an increased risk of developing bacterial vaginosis, including:  Having a new sex partner or multiple sex partners.  Douching.  Using an intrauterine device (IUD) for contraception. Women do not get bacterial vaginosis from toilet seats, bedding, swimming pools, or contact with objects around them. SIGNS AND SYMPTOMS  Some women with bacterial vaginosis have no signs or symptoms. Common symptoms include:  Grey vaginal discharge.  A fishlike odor with discharge, especially after sexual intercourse.  Itching or burning of the vagina and vulva.  Burning or pain with urination. DIAGNOSIS  Your health care provider will take a medical history and examine the vagina for signs of bacterial vaginosis. A sample of vaginal fluid may be taken. Your health care provider will look at this sample under a microscope to check for bacteria and abnormal cells. A vaginal pH test may also be done.  TREATMENT  Bacterial vaginosis may be treated with antibiotic medicines. These may be given in the form of a pill or a vaginal cream. A second round of antibiotics may be prescribed if the condition comes back after treatment.  HOME CARE INSTRUCTIONS   Only take over-the-counter or prescription medicines as directed by your health care provider.  If antibiotic medicine was prescribed, take it as directed. Make sure you finish it even if you start to feel better.  Do not have sex until treatment is completed.  Tell all sexual partners that you have a vaginal infection. They should see their health care provider and be treated if they have problems, such as a mild rash or itching.  Practice safe sex by using condoms and only having one sex  partner. SEEK MEDICAL CARE IF:   Your symptoms are not improving after 3 days of treatment.  You have increased discharge or pain.  You have a fever. MAKE SURE YOU:   Understand these instructions.  Will watch your condition.  Will get help right away if you are not doing well or get worse. FOR MORE INFORMATION  Centers for Disease Control and Prevention, Division of STD Prevention: [employer] American Sexual Health Association (ASHA): www.ashastd.org  Document Released: 07/13/2005 Document Revised: 05/03/2013 Document Reviewed: 02/22/2013 [facility] Patient Information 2015 [place], [place]. This information is not intended to replace advice given to you by your health care provider. Make sure you discuss any questions you have with your health care provider.

## 2014-05-21 NOTE — Addendum Note (Signed)
Addended by: Thurnell Garbe A on: 05/21/2014 11:40 AM   Modules accepted: Orders

## 2014-05-21 NOTE — Addendum Note (Signed)
Addended by: Thurnell Garbe A on: 05/21/2014 11:33 AM   Modules accepted: Orders

## 2014-05-21 NOTE — Progress Notes (Signed)
Dawn Young Aug 28, 1980 [id number]   History:    33 y.o.  gravida 4 para 4 (3 cesarean section/bilateral to a sterilization procedure) presented to the office for annual gynecological examination. Patient is a new patient to the practice. Patient was complaining of vaginal burning along with pruritus and small bumps that she had noted on external genitalia. Patient denied any change in sexual partners. Patient stated that when she was in her 26s she had an abnormal Pap smear had a biopsy but subsequent Pap smear were normal she had no treatment. Patient also has been complaining of skipping cycles as much as 3 months at a time. She had a menstrual cycle in June but skipped July August and September and started on October 12. She denied any nipple discharge or any unusual headache or visual disturbances.   Past medical history,surgical history, family history and social history were all reviewed and documented in the EPIC chart.  Gynecologic History Patient's last menstrual period was 05/12/2014. Contraception: tubal ligation Last Pap: Over a year ago. Results were: Patient reports it was normal Last mammogram: Not indicated. Results were: Not indicated  Obstetric History OB History  Gravida Para Term Preterm AB SAB TAB Ectopic Multiple Living  4 4        4     # Outcome Date GA Lbr Len/2nd Weight Sex Delivery Anes PTL Lv  4 PAR           3 PAR           2 PAR           1 PAR                ROS: A ROS was performed and pertinent positives and negatives are included in the history.  GENERAL: No fevers or chills. HEENT: No change in vision, no earache, sore throat or sinus congestion. NECK: No pain or stiffness. CARDIOVASCULAR: No chest pain or pressure. No palpitations. PULMONARY: No shortness of breath, cough or wheeze. GASTROINTESTINAL: No abdominal pain, nausea, vomiting or diarrhea, melena or bright red blood per rectum. GENITOURINARY: No urinary frequency, urgency, hesitancy or  dysuria. MUSCULOSKELETAL: No joint or muscle pain, no back pain, no recent trauma. DERMATOLOGIC: No rash, no itching, no lesions. ENDOCRINE: No polyuria, polydipsia, no heat or cold intolerance. No recent change in weight. HEMATOLOGICAL: No anemia or easy bruising or bleeding. NEUROLOGIC: No headache, seizures, numbness, tingling or weakness. PSYCHIATRIC: No depression, no loss of interest in normal activity or change in sleep pattern.     Exam: chaperone present  BP 132/88  Ht 5\' 5"  (1.651 m)  Wt 268 lb (121.564 kg)  BMI 44.60 kg/m2  LMP 05/12/2014  Body mass index is 44.6 kg/(m^2).  General appearance : Well developed well nourished female. No acute distress HEENT: Neck supple, trachea midline, no carotid bruits, no thyroidmegaly Lungs: Clear to auscultation, no rhonchi or wheezes, or rib retractions  Heart: Regular rate and rhythm, no murmurs or gallops Breast:Examined in sitting and supine position were symmetrical in appearance, no palpable masses or tenderness,  no skin retraction, no nipple inversion, no nipple discharge, no skin discoloration, no axillary or supraclavicular lymphadenopathy Abdomen: no palpable masses or tenderness, no rebound or guarding Extremities: no edema or skin discoloration or tenderness  Pelvic: External genitalia with several herpetic like lesions as well as the entrance of the vagina  Bartholin, Urethra, Skene Glands: Within normal limits  Vagina: No gross lesions or discharge  Cervix: No gross lesions or discharge  Uterus  anteverted, normal size, shape and consistency, non-tender and mobile  Adnexa  Without masses or tenderness  Anus and perineum  normal   Rectovaginal  normal sphincter tone without palpated masses or tenderness             Hemoccult not indicated   Wet prep:Amine Pos, , clue cells moderate, bacteria tumors to count  GC and chlamydia culture pending at time of this dictation  Assessment/Plan:  33 y.o. 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 Patient will be started on Valtrex 1 g 1 by mouth twice a day for 7 days. HSV cultures obtained. In order to complete the STD screening we will obtain an HIV, RPR, hepatitis B and C. The following labs were also ordered: CBC, fasting lipid profile, competence a metabolic panel, TSH, prolactin, and urinalysis. Pap smear was done today. Patient received her flu vaccine at the hospital early this year. If the above tests are normal because of her oligomenorrhea which is probably attributed to the fact that she weighs 268 pounds which would place her at high risk for endometrial hyperplasia or endometrial cancer she will be prescribed Provera to take 10 mg 1 by mouth daily for 10 days of each month if she does not have a spontaneous menses every 30 days. We discussed importance of monthly self breast examination. We will await for the results of the GC and chlamydia culture. For her bacterial vaginosis she'll be prescribed Tindamax 500 mg 4 tablets today for talus tomorrow. She'll be asked to return to the office in 3 weeks for follow-up.   Terrance Mass MD, 11:13 AM 05/21/2014

## 2014-05-22 ENCOUNTER — Other Ambulatory Visit: Payer: Self-pay | Admitting: Gynecology

## 2014-05-22 DIAGNOSIS — D509 Iron deficiency anemia, unspecified: Secondary | ICD-10-CM

## 2014-05-22 LAB — URINALYSIS W MICROSCOPIC + REFLEX CULTURE
Bilirubin Urine: NEGATIVE
Casts: NONE SEEN
Crystals: NONE SEEN
Glucose, UA: NEGATIVE mg/dL
Hgb urine dipstick: NEGATIVE
Ketones, ur: NEGATIVE mg/dL
Nitrite: POSITIVE — AB
Protein, ur: NEGATIVE mg/dL
Specific Gravity, Urine: 1.017 (ref 1.005–1.030)
Urobilinogen, UA: 0.2 mg/dL (ref 0.0–1.0)
WBC, UA: >50 WBC/hpf — AB (ref <3)
pH: 5.5 (ref 5.0–8.0)

## 2014-05-22 LAB — CYTOLOGY - PAP

## 2014-05-22 LAB — GC/CHLAMYDIA PROBE AMP
CT Probe RNA: NEGATIVE
GC Probe RNA: NEGATIVE

## 2014-05-22 LAB — PROLACTIN: Prolactin: 6.2 ng/mL

## 2014-05-23 ENCOUNTER — Other Ambulatory Visit: Payer: Self-pay | Admitting: Gynecology

## 2014-05-23 MED ORDER — CIPROFLOXACIN HCL 250 MG PO TABS: 250.0000 mg | ORAL_TABLET | Freq: Two times a day (BID) | ORAL | Status: DC

## 2014-05-24 LAB — URINE CULTURE: Colony Count: >=100000

## 2014-05-24 LAB — HERPES SIMPLEX VIRUS CULTURE: Organism ID, Bacteria: DETECTED

## 2014-05-28 ENCOUNTER — Encounter: Payer: Self-pay | Admitting: Gynecology

## 2014-06-11 ENCOUNTER — Ambulatory Visit: Payer: 59 | Admitting: Gynecology

## 2015-05-23 ENCOUNTER — Encounter: Payer: 59 | Admitting: Gynecology

## 2015-12-17 ENCOUNTER — Ambulatory Visit: Payer: 59 | Admitting: Family

## 2015-12-26 ENCOUNTER — Encounter: Payer: Self-pay | Admitting: Family

## 2015-12-26 ENCOUNTER — Ambulatory Visit (INDEPENDENT_AMBULATORY_CARE_PROVIDER_SITE_OTHER): Payer: 59 | Admitting: Family

## 2015-12-26 ENCOUNTER — Other Ambulatory Visit (INDEPENDENT_AMBULATORY_CARE_PROVIDER_SITE_OTHER): Payer: 59

## 2015-12-26 VITALS — BP 108/76 | HR 53 | Temp 98.5°F | Resp 16 | Ht 65.0 in | Wt 260.8 lb

## 2015-12-26 DIAGNOSIS — Z0000 Encounter for general adult medical examination without abnormal findings: Secondary | ICD-10-CM

## 2015-12-26 DIAGNOSIS — M546 Pain in thoracic spine: Secondary | ICD-10-CM | POA: Diagnosis not present

## 2015-12-26 DIAGNOSIS — E559 Vitamin D deficiency, unspecified: Secondary | ICD-10-CM

## 2015-12-26 DIAGNOSIS — D509 Iron deficiency anemia, unspecified: Secondary | ICD-10-CM | POA: Insufficient documentation

## 2015-12-26 DIAGNOSIS — Z23 Encounter for immunization: Secondary | ICD-10-CM

## 2015-12-26 LAB — CBC
HCT: 33.3 % — ABNORMAL LOW (ref 36.0–46.0)
Hemoglobin: 11.1 g/dL — ABNORMAL LOW (ref 12.0–15.0)
MCHC: 33.2 g/dL (ref 30.0–36.0)
MCV: 74.9 fl — ABNORMAL LOW (ref 78.0–100.0)
Platelets: 285 10*3/uL (ref 150.0–400.0)
RBC: 4.45 Mil/uL (ref 3.87–5.11)
RDW: 16.9 % — ABNORMAL HIGH (ref 11.5–15.5)
WBC: 7.1 10*3/uL (ref 4.0–10.5)

## 2015-12-26 LAB — COMPREHENSIVE METABOLIC PANEL
ALT: 12 U/L (ref 0–35)
AST: 11 U/L (ref 0–37)
Albumin: 4.3 g/dL (ref 3.5–5.2)
Alkaline Phosphatase: 61 U/L (ref 39–117)
BUN: 12 mg/dL (ref 6–23)
CO2: 28 mEq/L (ref 19–32)
Calcium: 9.7 mg/dL (ref 8.4–10.5)
Chloride: 105 mEq/L (ref 96–112)
Creatinine, Ser: 0.81 mg/dL (ref 0.40–1.20)
GFR: 104.5 mL/min (ref >60.00)
Glucose, Bld: 93 mg/dL (ref 70–99)
Potassium: 4.3 mEq/L (ref 3.5–5.1)
Sodium: 138 mEq/L (ref 135–145)
Total Bilirubin: 0.4 mg/dL (ref 0.2–1.2)
Total Protein: 7.9 g/dL (ref 6.0–8.3)

## 2015-12-26 LAB — IBC PANEL
Iron: 30 ug/dL — ABNORMAL LOW (ref 42–145)
Saturation Ratios: 8 % — ABNORMAL LOW (ref 20.0–50.0)
Transferrin: 267 mg/dL (ref 212.0–360.0)

## 2015-12-26 LAB — LIPID PANEL
Cholesterol: 162 mg/dL (ref 0–200)
HDL: 38.7 mg/dL — ABNORMAL LOW (ref >39.00)
LDL Cholesterol: 107 mg/dL — ABNORMAL HIGH (ref 0–99)
NonHDL: 123.44
Total CHOL/HDL Ratio: 4
Triglycerides: 80 mg/dL (ref 0.0–149.0)
VLDL: 16 mg/dL (ref 0.0–40.0)

## 2015-12-26 LAB — VITAMIN D 25 HYDROXY (VIT D DEFICIENCY, FRACTURES): VITD: 8.13 ng/mL — ABNORMAL LOW (ref 30.00–100.00)

## 2015-12-26 LAB — TSH: TSH: 1.33 u[IU]/mL (ref 0.35–4.50)

## 2015-12-26 MED ORDER — VITAMIN D3 1.25 MG (50000 UT) PO TABS: 50000.0000 [IU] | ORAL_TABLET | ORAL | Status: DC

## 2015-12-26 NOTE — Progress Notes (Signed)
Pre visit review using our clinic review tool, if applicable. No additional management support is needed unless otherwise documented below in the visit note. 

## 2015-12-26 NOTE — Patient Instructions (Addendum)
Thank you for choosing [facility].  Summary/Instructions:  Please stop by the lab on the basement level of the building for your blood work. Your results will be released to MyChart (or called to you) after review, usually within 72 hours after test completion. If any changes need to be made, you will be notified at that same time.  If your symptoms worsen or fail to improve, please contact our office for further instruction, or in case of emergency go directly to the emergency room at the closest medical facility.   Health Maintenance, Female Adopting a healthy lifestyle and getting preventive care can go a long way to promote health and wellness. Talk with your health care provider about what schedule of regular examinations is right for you. This is a good chance for you to check in with your provider about disease prevention and staying healthy. In between checkups, there are plenty of things you can do on your own. Experts have done a lot of research about which lifestyle changes and preventive measures are most likely to keep you healthy. Ask your health care provider for more information. WEIGHT AND DIET  Eat a healthy diet  Be sure to include plenty of vegetables, fruits, low-fat dairy products, and lean protein.  Do not eat a lot of foods high in solid fats, added sugars, or salt.  Get regular exercise. This is one of the most important things you can do for your health.  Most adults should exercise for at least 150 minutes each week. The exercise should increase your heart rate and make you sweat (moderate-intensity exercise).  Most adults should also do strengthening exercises at least twice a week. This is in addition to the moderate-intensity exercise.  Maintain a healthy weight  Body mass index (BMI) is a measurement that can be used to identify possible weight problems. It estimates body fat based on height and weight. Your health care provider can help determine your  BMI and help you achieve or maintain a healthy weight.  For females 20 years of age and older:   A BMI below 18.5 is considered underweight.  A BMI of 18.5 to 24.9 is normal.  A BMI of 25 to 29.9 is considered overweight.  A BMI of 30 and above is considered obese.  Watch levels of cholesterol and blood lipids  You should start having your blood tested for lipids and cholesterol at 33 years of age, then have this test every 5 years.  You may need to have your cholesterol levels checked more often if:  Your lipid or cholesterol levels are high.  You are older than 33 years of age.  You are at high risk for heart disease.  CANCER SCREENING   Lung Cancer  Lung cancer screening is recommended for adults 55-80 years old who are at high risk for lung cancer because of a history of smoking.  A yearly low-dose CT scan of the lungs is recommended for people who:  Currently smoke.  Have quit within the past 15 years.  Have at least a 30-pack-year history of smoking. A pack year is smoking an average of one pack of cigarettes a day for 1 year.  Yearly screening should continue until it has been 15 years since you quit.  Yearly screening should stop if you develop a health problem that would prevent you from having lung cancer treatment.  Breast Cancer  Practice breast self-awareness. This means understanding how your breasts normally appear and feel.  It   also means doing regular breast self-exams. Let your health care provider know about any changes, no matter how small.  If you are in your 20s or 30s, you should have a clinical breast exam (CBE) by a health care provider every 1-3 years as part of a regular health exam.  If you are 40 or older, have a CBE every year. Also consider having a breast X-ray (mammogram) every year.  If you have a family history of breast cancer, talk to your health care provider about genetic screening.  If you are at high risk for breast  cancer, talk to your health care provider about having an MRI and a mammogram every year.  Breast cancer gene (BRCA) assessment is recommended for women who have family members with BRCA-related cancers. BRCA-related cancers include:  Breast.  Ovarian.  Tubal.  Peritoneal cancers.  Results of the assessment will determine the need for genetic counseling and BRCA1 and BRCA2 testing. Cervical Cancer Your health care provider may recommend that you be screened regularly for cancer of the pelvic organs (ovaries, uterus, and vagina). This screening involves a pelvic examination, including checking for microscopic changes to the surface of your cervix (Pap test). You may be encouraged to have this screening done every 3 years, beginning at age 21.  For women ages 30-65, health care providers may recommend pelvic exams and Pap testing every 3 years, or they may recommend the Pap and pelvic exam, combined with testing for human papilloma virus (HPV), every 5 years. Some types of HPV increase your risk of cervical cancer. Testing for HPV may also be done on women of any age with unclear Pap test results.  Other health care providers may not recommend any screening for nonpregnant women who are considered low risk for pelvic cancer and who do not have symptoms. Ask your health care provider if a screening pelvic exam is right for you.  If you have had past treatment for cervical cancer or a condition that could lead to cancer, you need Pap tests and screening for cancer for at least 20 years after your treatment. If Pap tests have been discontinued, your risk factors (such as having a new sexual partner) need to be reassessed to determine if screening should resume. Some women have medical problems that increase the chance of getting cervical cancer. In these cases, your health care provider may recommend more frequent screening and Pap tests. Colorectal Cancer  This type of cancer can be detected and  often prevented.  Routine colorectal cancer screening usually begins at 33 years of age and continues through 33 years of age.  Your health care provider may recommend screening at an earlier age if you have risk factors for colon cancer.  Your health care provider may also recommend using home test kits to check for hidden blood in the stool.  A small camera at the end of a tube can be used to examine your colon directly (sigmoidoscopy or colonoscopy). This is done to check for the earliest forms of colorectal cancer.  Routine screening usually begins at age 50.  Direct examination of the colon should be repeated every 5-10 years through 33 years of age. However, you may need to be screened more often if early forms of precancerous polyps or small growths are found. Skin Cancer  Check your skin from head to toe regularly.  Tell your health care provider about any new moles or changes in moles, especially if there is a change in a mole's   shape or color.  Also tell your health care provider if you have a mole that is larger than the size of a pencil eraser.  Always use sunscreen. Apply sunscreen liberally and repeatedly throughout the day.  Protect yourself by wearing long sleeves, pants, a wide-brimmed hat, and sunglasses whenever you are outside. HEART DISEASE, DIABETES, AND HIGH BLOOD PRESSURE   High blood pressure causes heart disease and increases the risk of stroke. High blood pressure is more likely to develop in:  People who have blood pressure in the high end of the normal range (130-139/85-89 mm Hg).  People who are overweight or obese.  People who are African American.  If you are 18-39 years of age, have your blood pressure checked every 3-5 years. If you are 40 years of age or older, have your blood pressure checked every year. You should have your blood pressure measured twice--once when you are at a hospital or clinic, and once when you are not at a hospital or clinic.  Record the average of the two measurements. To check your blood pressure when you are not at a hospital or clinic, you can use:  An automated blood pressure machine at a pharmacy.  A home blood pressure monitor.  If you are between 55 years and 79 years old, ask your health care provider if you should take aspirin to prevent strokes.  Have regular diabetes screenings. This involves taking a blood sample to check your fasting blood sugar level.  If you are at a normal weight and have a low risk for diabetes, have this test once every three years after 33 years of age.  If you are overweight and have a high risk for diabetes, consider being tested at a younger age or more often. PREVENTING INFECTION  Hepatitis B  If you have a higher risk for hepatitis B, you should be screened for this virus. You are considered at high risk for hepatitis B if:  You were born in a country where hepatitis B is common. Ask your health care provider which countries are considered high risk.  Your parents were born in a high-risk country, and you have not been immunized against hepatitis B (hepatitis B vaccine).  You have HIV or AIDS.  You use needles to inject street drugs.  You live with someone who has hepatitis B.  You have had sex with someone who has hepatitis B.  You get hemodialysis treatment.  You take certain medicines for conditions, including cancer, organ transplantation, and autoimmune conditions. Hepatitis C  Blood testing is recommended for:  Everyone born from 1945 through 1965.  Anyone with known risk factors for hepatitis C. Sexually transmitted infections (STIs)  You should be screened for sexually transmitted infections (STIs) including gonorrhea and chlamydia if:  You are sexually active and are younger than 33 years of age.  You are older than 33 years of age and your health care provider tells you that you are at risk for this type of infection.  Your sexual activity  has changed since you were last screened and you are at an increased risk for chlamydia or gonorrhea. Ask your health care provider if you are at risk.  If you do not have HIV, but are at risk, it may be recommended that you take a prescription medicine daily to prevent HIV infection. This is called pre-exposure prophylaxis (PrEP). You are considered at risk if:  You are sexually active and do not regularly use condoms or know the   HIV status of your partner(s).  You take drugs by injection.  You are sexually active with a partner who has HIV. Talk with your health care provider about whether you are at high risk of being infected with HIV. If you choose to begin PrEP, you should first be tested for HIV. You should then be tested every 3 months for as long as you are taking PrEP.  PREGNANCY   If you are premenopausal and you may become pregnant, ask your health care provider about preconception counseling.  If you may become pregnant, take 400 to 800 micrograms (mcg) of folic acid every day.  If you want to prevent pregnancy, talk to your health care provider about birth control (contraception). OSTEOPOROSIS AND MENOPAUSE   Osteoporosis is a disease in which the bones lose minerals and strength with aging. This can result in serious bone fractures. Your risk for osteoporosis can be identified using a bone density scan.  If you are 65 years of age or older, or if you are at risk for osteoporosis and fractures, ask your health care provider if you should be screened.  Ask your health care provider whether you should take a calcium or vitamin D supplement to lower your risk for osteoporosis.  Menopause may have certain physical symptoms and risks.  Hormone replacement therapy may reduce some of these symptoms and risks. Talk to your health care provider about whether hormone replacement therapy is right for you.  HOME CARE INSTRUCTIONS   Schedule regular health, dental, and eye  exams.  Stay current with your immunizations.   Do not use any tobacco products including cigarettes, chewing tobacco, or electronic cigarettes.  If you are pregnant, do not drink alcohol.  If you are breastfeeding, limit how much and how often you drink alcohol.  Limit alcohol intake to no more than 1 drink per day for nonpregnant women. One drink equals 12 ounces of beer, 5 ounces of wine, or 1 ounces of hard liquor.  Do not use street drugs.  Do not share needles.  Ask your health care provider for help if you need support or information about quitting drugs.  Tell your health care provider if you often feel depressed.  Tell your health care provider if you have ever been abused or do not feel safe at home.   This information is not intended to replace advice given to you by your health care provider. Make sure you discuss any questions you have with your health care provider.   Document Released: 01/26/2011 Document Revised: 08/03/2014 Document Reviewed: 06/14/2013 Elsevier Interactive Patient Education 2016 [employer]   

## 2015-12-26 NOTE — Assessment & Plan Note (Signed)
Thoracic back pain most likely associated with large breast size which may be relieved through a breast reduction surgery. She has tried multiple stretching, strengthening, and proper bra fitting with no success. Referral placed to plastic surgery to discuss possibility of breast reduction. Continue conservative treatment of ice/heat and stretching. Follow-up pending referral

## 2015-12-26 NOTE — Progress Notes (Signed)
Subjective:    Patient ID: Dawn Young, female    DOB: [birth year], 35 y.o.   MRN: [id number]  Chief Complaint  Patient presents with  . Establish Care    CPE, fasting    HPI:  Dawn Young is a 35 y.o. female who presents today for an annual wellness visit.   1) Health Maintenance -   Diet - Averages about 3 meals per day consisting of fruits, vegetables, salads, and chicken; Denies caffeine intake.   Exercise - Daily; Walks, go to the gym - cardio and resistance training   2) Preventative Exams / Immunizations:  Dental -- Due for exam  Vision -- Due for exam   Health Maintenance  Topic Date Due  . TETANUS/TDAP  08/21/2001  . INFLUENZA VACCINE  02/25/2016  . PAP SMEAR  05/21/2017  . HIV Screening  Completed    Immunization History  Administered Date(s) Administered  . Tdap 12/26/2015    3.) Thoracic back pain - this is a new problem. Associated symptom of pain located in the thoracic region of her spine as well as her upper trapezius have been going on for approximately 3 years. Described as achy. Modifying factors include stretching and strengthening which have not helped. Believe this is most likely related to her breast size. Other modifying factors include changing bras for support which has not helped with the pain. Would like to acquire information about a possible breast reduction.  No Known Allergies   Outpatient Prescriptions Prior to Visit  Medication Sig Dispense Refill  . ciprofloxacin (CIPRO) 250 MG tablet Take 1 tablet (250 mg total) by mouth 2 (two) times daily. 6 tablet 0  . medroxyPROGESTERone (PROVERA) 10 MG tablet Take one for ten days of each month IF no menses q 30 days 30 tablet 4  . tinidazole (TINDAMAX) 500 MG tablet Take four tablets today and four tablets tomorrow at the same time 8 tablet 0  . valACYclovir (VALTREX) 1000 MG tablet Take 1 tablet (1,000 mg total) by mouth 2 (two) times daily. 14 tablet 1   No  facility-administered medications prior to visit.     Past Medical History  Diagnosis Date  . Hx of migraine headaches   . Hx of migraines   . Headache      Past Surgical History  Procedure Laterality Date  . Cholecystectomy  2007  . Tubal ligation    . Cesarean section  03, 05, 06    x's 3     Family History  Problem Relation Age of Onset  . Healthy Mother   . Healthy Father   . Healthy Maternal Grandmother   . Healthy Maternal Grandfather   . Healthy Paternal Grandmother   . Healthy Paternal Grandfather      Social History   Social History  . Marital Status: Single    Spouse Name: N/A  . Number of Children: 4  . Years of Education: 12   Occupational History  . CNA    Social History Main Topics  . Smoking status: Current Some Day Smoker -- 0.10 packs/day for 5 years    Types: Cigarettes  . Smokeless tobacco: Never Used  . Alcohol Use: Yes     Comment: occasional  . Drug Use: No  . Sexual Activity: Yes   Other Topics Concern  . Not on file   Social History Narrative   Fun: Rest   Denies abuse and feels safe at home    Review of Systems  Constitutional: Denies fever, chills, fatigue, or significant weight gain/loss. HENT: Head: Denies headache or neck pain Ears: Denies changes in hearing, ringing in ears, earache, drainage Nose: Denies discharge, stuffiness, itching, nosebleed, sinus pain Throat: Denies sore throat, hoarseness, dry mouth, sores, thrush Eyes: Denies loss/changes in vision, pain, redness, blurry/double vision, flashing lights Cardiovascular: Denies chest pain/discomfort, tightness, palpitations, shortness of breath with activity, difficulty lying down, swelling, sudden awakening with shortness of breath Respiratory: Denies shortness of breath, cough, sputum production, wheezing Gastrointestinal: Denies dysphasia, heartburn, change in appetite, nausea, change in bowel habits, rectal bleeding, constipation, diarrhea, yellow skin or  eyes Genitourinary: Denies frequency, urgency, burning/pain, blood in urine, incontinence, change in urinary strength. Musculoskeletal: Denies muscle/joint pain, stiffness, back pain, redness or swelling of joints, trauma Skin: Denies rashes, lumps, itching, dryness, color changes, or hair/nail changes Neurological: Denies dizziness, fainting, seizures, weakness, numbness, tingling, tremor Psychiatric - Denies nervousness, stress, depression or memory loss Endocrine: Denies heat or cold intolerance, sweating, frequent urination, excessive thirst, changes in appetite Hematologic: Denies ease of bruising or bleeding     Objective:     BP 108/76 mmHg  Pulse 53  Temp(Src) 98.5 F (36.9 C) (Oral)  Resp 16  Ht 5\' 5"  (1.651 m)  Wt 260 lb 12.8 oz (118.298 kg)  BMI 43.40 kg/m2  SpO2 98% Nursing note and vital signs reviewed. . Physical Exam  Constitutional: She is oriented to person, place, and time. She appears well-developed and well-nourished.  HENT:  Head: Normocephalic.  Right Ear: Hearing, tympanic membrane, external ear and ear canal normal.  Left Ear: Hearing, tympanic membrane, external ear and ear canal normal.  Nose: Nose normal.  Mouth/Throat: Uvula is midline, oropharynx is clear and moist and mucous membranes are normal.  Eyes: Conjunctivae and EOM are normal. Pupils are equal, round, and reactive to light.  Neck: Neck supple. No JVD present. No tracheal deviation present. No thyromegaly present.  Cardiovascular: Normal rate, regular rhythm, normal heart sounds and intact distal pulses.   Pulmonary/Chest: Effort normal and breath sounds normal.  Abdominal: Soft. Bowel sounds are normal. She exhibits no distension and no mass. There is no tenderness. There is no rebound and no guarding.  Musculoskeletal: Normal range of motion. She exhibits no edema or tenderness.  Thoracic back pain - no obvious deformity, discoloration, or edema noted. Mild tenderness bilaterally along the  paraspinal musculature and upper trapezius. Range of motion is within normal limits. Distal pulses and sensation are intact and appropriate.  Lymphadenopathy:    She has no cervical adenopathy.  Neurological: She is alert and oriented to person, place, and time. She has normal reflexes. No cranial nerve deficit. She exhibits normal muscle tone. Coordination normal.  Skin: Skin is warm and dry.  Psychiatric: She has a normal mood and affect. Her behavior is normal. Judgment and thought content normal.       Assessment & Plan:   Problem List Items Addressed This Visit      Other   Routine general medical examination at a health care facility - Primary    1) Anticipatory Guidance: Discussed importance of wearing a seatbelt while driving and not texting while driving; changing batteries in smoke detector at least once annually; wearing suntan lotion when outside; eating a balanced and moderate diet; getting physical activity at least 30 minutes per day.  2) Immunizations / Screenings / Labs:  Tetanus updated today. All other immunizations are up-to-date per recommendations. Obtain vitamin D for vitamin D deficiency screening. Obtain IBC panel  for anemia screening. Due for a dental and vision exam occurred be completed independently. All other screenings are up-to-date per recommendations. Obtain CBC, CMET, Lipid profile and TSH.   Overall well exam with risk factors for cardiovascular disease including obesity which is most likely related to her breast size. She exercises regularly and eats a fairly well balanced diet. Recommended weight loss of approximately 5-10% of current body weight through continued nutrition and physical activity. Continue other healthy lifestyle behaviors and choices. Follow-up prevention exam in 1 year. Follow-up office visit pending blood work as necessary.       Relevant Orders   CBC   Comprehensive metabolic panel   VITAMIN D 25 Hydroxy (Vit-D Deficiency,  Fractures)   Lipid panel   TSH   IBC panel   Thoracic back pain    Thoracic back pain most likely associated with large breast size which may be relieved through a breast reduction surgery. She has tried multiple stretching, strengthening, and proper bra fitting with no success. Referral placed to plastic surgery to discuss possibility of breast reduction. Continue conservative treatment of ice/heat and stretching. Follow-up pending referral      Relevant Orders   Ambulatory referral to Plastic Surgery    Other Visit Diagnoses    Need for Tdap vaccination        Relevant Orders    Tdap vaccine greater than or equal to 7yo IM (Completed)        I have discontinued Ms. Kitts's medroxyPROGESTERone, valACYclovir, tinidazole, and ciprofloxacin.   No orders of the defined types were placed in this encounter.     Follow-up: No Follow-up on file.   Mauricio Po, FNP

## 2015-12-26 NOTE — Assessment & Plan Note (Signed)
1) Anticipatory Guidance: Discussed importance of wearing a seatbelt while driving and not texting while driving; changing batteries in smoke detector at least once annually; wearing suntan lotion when outside; eating a balanced and moderate diet; getting physical activity at least 30 minutes per day.  2) Immunizations / Screenings / Labs:  Tetanus updated today. All other immunizations are up-to-date per recommendations. Obtain vitamin D for vitamin D deficiency screening. Obtain IBC panel for anemia screening. Due for a dental and vision exam occurred be completed independently. All other screenings are up-to-date per recommendations. Obtain CBC, CMET, Lipid profile and TSH.   Overall well exam with risk factors for cardiovascular disease including obesity which is most likely related to her breast size. She exercises regularly and eats a fairly well balanced diet. Recommended weight loss of approximately 5-10% of current body weight through continued nutrition and physical activity. Continue other healthy lifestyle behaviors and choices. Follow-up prevention exam in 1 year. Follow-up office visit pending blood work as necessary.

## 2016-01-15 DIAGNOSIS — N62 Hypertrophy of breast: Secondary | ICD-10-CM | POA: Diagnosis not present

## 2016-02-19 ENCOUNTER — Other Ambulatory Visit: Payer: Self-pay | Admitting: Family

## 2016-05-11 ENCOUNTER — Encounter: Payer: 59 | Admitting: Gynecology

## 2016-09-28 ENCOUNTER — Encounter: Payer: 59 | Admitting: Gynecology

## 2016-10-09 ENCOUNTER — Encounter: Payer: 59 | Admitting: Gynecology

## 2016-11-18 ENCOUNTER — Encounter: Payer: Self-pay | Admitting: Family

## 2016-11-18 DIAGNOSIS — Z01419 Encounter for gynecological examination (general) (routine) without abnormal findings: Secondary | ICD-10-CM

## 2016-11-18 DIAGNOSIS — N62 Hypertrophy of breast: Secondary | ICD-10-CM

## 2016-11-30 ENCOUNTER — Telehealth: Payer: Self-pay | Admitting: Obstetrics and Gynecology

## 2016-11-30 NOTE — Telephone Encounter (Signed)
Called and left a message for patient to call back to schedule a new patient doctor referral. °

## 2016-12-02 NOTE — Telephone Encounter (Signed)
Called and left a message for patient to call back to schedule a new patient doctor referral. °

## 2016-12-07 NOTE — Telephone Encounter (Signed)
Called and left a message for patient to call back to schedule a new patient doctor referral. °

## 2016-12-08 NOTE — Telephone Encounter (Signed)
Called and left a message for patient to call back to schedule a new patient doctor referral. °

## 2016-12-09 ENCOUNTER — Encounter: Payer: Self-pay | Admitting: Gynecology

## 2016-12-09 NOTE — Telephone Encounter (Signed)
Called and left a message for patient to call back to schedule a new patient doctor referral. °

## 2016-12-17 ENCOUNTER — Ambulatory Visit (INDEPENDENT_AMBULATORY_CARE_PROVIDER_SITE_OTHER): Payer: 59 | Admitting: Family

## 2016-12-17 ENCOUNTER — Encounter: Payer: Self-pay | Admitting: Family

## 2016-12-17 VITALS — BP 116/80 | HR 75 | Temp 98.6°F | Resp 14 | Ht 65.0 in | Wt 264.8 lb

## 2016-12-17 DIAGNOSIS — Z6841 Body Mass Index (BMI) 40.0 and over, adult: Secondary | ICD-10-CM | POA: Diagnosis not present

## 2016-12-17 DIAGNOSIS — Z0000 Encounter for general adult medical examination without abnormal findings: Secondary | ICD-10-CM

## 2016-12-17 NOTE — Progress Notes (Signed)
Subjective:    Patient ID: Dawn Young, female    DOB: 06-17-1981, 36 y.o.   MRN: [id number]  Chief Complaint  Patient presents with  . CPE    not fasting    HPI:  Dawn Young is a 36 y.o. female who presents today for an annual wellness visit.   1) Health Maintenance -   Diet - Averages about 2-3 meals per day consisting of a regular diet; 1-2 cups of caffeine per day.   Exercise - Walking at work; No structured exercise outside of work.    2) Preventative Exams / Immunizations:  Dental -- Due for exam   Vision -- Due for exam    Health Maintenance  Topic Date Due  . INFLUENZA VACCINE  02/24/2017  . PAP SMEAR  05/21/2017  . TETANUS/TDAP  12/25/2025  . HIV Screening  Completed    Immunization History  Administered Date(s) Administered  . Tdap 12/26/2015     No Known Allergies   Outpatient Medications Prior to Visit  Medication Sig Dispense Refill  . Cholecalciferol (VITAMIN D3) 50000 units CAPS TAKE ONE CAPSULE BY MOUTH ONCE WEEKLY 8 capsule 0   No facility-administered medications prior to visit.      Past Medical History:  Diagnosis Date  . Headache   . Hx of migraine headaches   . Hx of migraines      Past Surgical History:  Procedure Laterality Date  . CESAREAN SECTION  03, 05, 06   x's 3  . CHOLECYSTECTOMY  2007  . TUBAL LIGATION       Family History  Problem Relation Age of Onset  . Healthy Mother   . Healthy Father   . Healthy Maternal Grandmother   . Healthy Maternal Grandfather   . Healthy Paternal Grandmother   . Healthy Paternal Grandfather      Social History   Social History  . Marital status: Single    Spouse name: N/A  . Number of children: 4  . Years of education: 12   Occupational History  . CNA    Social History Main Topics  . Smoking status: Current Some Day Smoker    Packs/day: 0.10    Years: 5.00    Types: Cigarettes  . Smokeless tobacco: Never Used  . Alcohol use Yes     Comment:  occasional  . Drug use: No  . Sexual activity: Yes   Other Topics Concern  . Not on file   Social History Narrative   Fun: Rest   Denies abuse and feels safe at home      Review of Systems  Constitutional: Denies fever, chills, fatigue, or significant weight gain/loss. HENT: Head: Denies headache or neck pain Ears: Denies changes in hearing, ringing in ears, earache, drainage Nose: Denies discharge, stuffiness, itching, nosebleed, sinus pain Throat: Denies sore throat, hoarseness, dry mouth, sores, thrush Eyes: Denies loss/changes in vision, pain, redness, blurry/double vision, flashing lights Cardiovascular: Denies chest pain/discomfort, tightness, palpitations, shortness of breath with activity, difficulty lying down, swelling, sudden awakening with shortness of breath Respiratory: Denies shortness of breath, cough, sputum production, wheezing Gastrointestinal: Denies dysphasia, heartburn, change in appetite, nausea, change in bowel habits, rectal bleeding, constipation, diarrhea, yellow skin or eyes Genitourinary: Denies frequency, urgency, burning/pain, blood in urine, incontinence, change in urinary strength. Musculoskeletal: Denies muscle/joint pain, stiffness, back pain, redness or swelling of joints, trauma Skin: Denies rashes, lumps, itching, dryness, color changes, or hair/nail changes Neurological: Denies dizziness, fainting, seizures, weakness, numbness,  tingling, tremor Psychiatric - Denies nervousness, stress, depression or memory loss Endocrine: Denies heat or cold intolerance, sweating, frequent urination, excessive thirst, changes in appetite Hematologic: Denies ease of bruising or bleeding     Objective:     BP 116/80 (BP Location: Left Arm, Patient Position: Sitting, Cuff Size: Large)   Pulse 75   Temp 98.6 F (37 C) (Oral)   Resp 14   Ht 5\' 5"  (1.651 m)   Wt 264 lb 12.8 oz (120.1 kg)   SpO2 96%   BMI 44.07 kg/m  Nursing note and vital signs  reviewed.  Wt Readings from Last 3 Encounters:  12/17/16 264 lb 12.8 oz (120.1 kg)  12/26/15 260 lb 12.8 oz (118.3 kg)  05/21/14 268 lb (121.6 kg)    Physical Exam  Constitutional: She is oriented to person, place, and time. She appears well-developed and well-nourished.  HENT:  Head: Normocephalic.  Right Ear: Hearing, tympanic membrane, external ear and ear canal normal.  Left Ear: Hearing, tympanic membrane, external ear and ear canal normal.  Nose: Nose normal.  Mouth/Throat: Uvula is midline, oropharynx is clear and moist and mucous membranes are normal.  Eyes: Conjunctivae and EOM are normal. Pupils are equal, round, and reactive to light.  Neck: Neck supple. No JVD present. No tracheal deviation present. No thyromegaly present.  Cardiovascular: Normal rate, regular rhythm, normal heart sounds and intact distal pulses.   Pulmonary/Chest: Effort normal and breath sounds normal.  Abdominal: Soft. Bowel sounds are normal. She exhibits no distension and no mass. There is no tenderness. There is no rebound and no guarding.  Musculoskeletal: Normal range of motion. She exhibits no edema or tenderness.  Lymphadenopathy:    She has no cervical adenopathy.  Neurological: She is alert and oriented to person, place, and time. She has normal reflexes. No cranial nerve deficit. She exhibits normal muscle tone. Coordination normal.  Skin: Skin is warm and dry.  Psychiatric: She has a normal mood and affect. Her behavior is normal. Judgment and thought content normal.       Assessment & Plan:   Problem List Items Addressed This Visit      Other   Obesity    BMI 44. Recommend weight loss of 5-10% of current body weight through nutrition and physical activity. Encouraged a nutritional intake that is moderate, balance, and varied. Does walk significantly while at work. Encouraged to increase activity outside of work. Goal is 10,000 steps per day for about 30 minutes of moderate level activity  daily. Decrease saturated fats and processed/sugary foods and emphasize protein in nutrient dense foods. Sitter weight loss program such as Weight Watchers, Rick[employer] Patience, or Nutrisystem.      Routine general medical examination at a health care facility - Primary    1) Anticipatory Guidance: Discussed importance of wearing a seatbelt while driving and not texting while driving; changing batteries in smoke detector at least once annually; wearing suntan lotion when outside; eating a balanced and moderate diet; getting physical activity at least 30 minutes per day.  2) Immunizations / Screenings / Labs:  All immunizations are up-to-date per recommendations. Cervical cancer screening is up-to-date. Due for a dental and vision exam encouraged to be completed independently. All other screenings are up-to-date per recommendations. Obtain CBC, CMET, and lipid profile.  Overall well exam with risk factors for cardiovascular disease including obesity. Recommend weight loss of 5-10% of current body weight through nutrition and physical activity. Her breast size does provide a challenge as  far as weight loss in addition to back pain. This has been refractory to proper fitting bras and posture control. Continue other healthy lifestyle behaviors and choices. Follow-up prevention exam in 1 year. Follow-up office visit pending blood work.        Relevant Orders   Comprehensive metabolic panel   CBC   Lipid panel   VITAMIN D 25 Hydroxy (Vit-D Deficiency, Fractures)   Varicella zoster antibody, IgG      I am having Ms. Fiorello maintain her Vitamin D3.    Follow-up: Return in about 6 months (around 06/19/2017), or if symptoms worsen or fail to improve.   Mauricio Po, FNP

## 2016-12-17 NOTE — Assessment & Plan Note (Signed)
1) Anticipatory Guidance: Discussed importance of wearing a seatbelt while driving and not texting while driving; changing batteries in smoke detector at least once annually; wearing suntan lotion when outside; eating a balanced and moderate diet; getting physical activity at least 30 minutes per day.  2) Immunizations / Screenings / Labs:  All immunizations are up-to-date per recommendations. Cervical cancer screening is up-to-date. Due for a dental and vision exam encouraged to be completed independently. All other screenings are up-to-date per recommendations. Obtain CBC, CMET, and lipid profile.  Overall well exam with risk factors for cardiovascular disease including obesity. Recommend weight loss of 5-10% of current body weight through nutrition and physical activity. Her breast size does provide a challenge as far as weight loss in addition to back pain. This has been refractory to proper fitting bras and posture control. Continue other healthy lifestyle behaviors and choices. Follow-up prevention exam in 1 year. Follow-up office visit pending blood work.

## 2016-12-17 NOTE — Patient Instructions (Signed)
Thank you for choosing [facility].  SUMMARY AND INSTRUCTIONS:  Please continue to work on weight loss.   We will work on breast reduction surgery.  Labs:  Please stop by the lab on the lower level of the building for your blood work. Your results will be released to [place] (or called to you) after review, usually within 72 hours after test completion. If any changes need to be made, you will be notified at that same time.  1.) The lab is open from 7:30am to 5:30 pm Monday-Friday 2.) No appointment is necessary 3.) Fasting (if needed) is 6-8 hours after food and drink; black coffee and water are okay   Follow up:  If your symptoms worsen or fail to improve, please contact our office for further instruction, or in case of emergency go directly to the emergency room at the closest medical facility.   Health Maintenance, Female Adopting a healthy lifestyle and getting preventive care can go a long way to promote health and wellness. Talk with your health care provider about what schedule of regular examinations is right for you. This is a good chance for you to check in with your provider about disease prevention and staying healthy. In between checkups, there are plenty of things you can do on your own. Experts have done a lot of research about which lifestyle changes and preventive measures are most likely to keep you healthy. Ask your health care provider for more information. Weight and diet Eat a healthy diet  Be sure to include plenty of vegetables, fruits, low-fat dairy products, and lean protein.  Do not eat a lot of foods high in solid fats, added sugars, or salt.  Get regular exercise. This is one of the most important things you can do for your health.  Most adults should exercise for at least 150 minutes each week. The exercise should increase your heart rate and make you sweat (moderate-intensity exercise).  Most adults should also do strengthening exercises at  least twice a week. This is in addition to the moderate-intensity exercise. Maintain a healthy weight  Body mass index (BMI) is a measurement that can be used to identify possible weight problems. It estimates body fat based on height and weight. Your health care provider can help determine your BMI and help you achieve or maintain a healthy weight.  For females 6 years of age and older:  A BMI below 18.5 is considered underweight.  A BMI of 18.5 to 24.9 is normal.  A BMI of 25 to 29.9 is considered overweight.  A BMI of 30 and above is considered obese. Watch levels of cholesterol and blood lipids  You should start having your blood tested for lipids and cholesterol at 34 years of age, then have this test every 5 years.  You may need to have your cholesterol levels checked more often if:  Your lipid or cholesterol levels are high.  You are older than 34 years of age.  You are at high risk for heart disease. Cancer screening Lung Cancer  Lung cancer screening is recommended for adults 81-60 years old who are at high risk for lung cancer because of a history of smoking.  A yearly low-dose CT scan of the lungs is recommended for people who:  Currently smoke.  Have quit within the past 15 years.  Have at least a 30-pack-year history of smoking. A pack year is smoking an average of one pack of cigarettes a day for 1 year.  Yearly screening should continue until it has been 15 years since you quit.  Yearly screening should stop if you develop a health problem that would prevent you from having lung cancer treatment. Breast Cancer  Practice breast self-awareness. This means understanding how your breasts normally appear and feel.  It also means doing regular breast self-exams. Let your health care provider know about any changes, no matter how small.  If you are in your 20s or 30s, you should have a clinical breast exam (CBE) by a health care provider every 1-3 years as part  of a regular health exam.  If you are 57 or older, have a CBE every year. Also consider having a breast X-ray (mammogram) every year.  If you have a family history of breast cancer, talk to your health care provider about genetic screening.  If you are at high risk for breast cancer, talk to your health care provider about having an MRI and a mammogram every year.  Breast cancer gene (BRCA) assessment is recommended for women who have family members with BRCA-related cancers. BRCA-related cancers include:  Breast.  Ovarian.  Tubal.  Peritoneal cancers.  Results of the assessment will determine the need for genetic counseling and BRCA1 and BRCA2 testing. Cervical Cancer  Your health care provider may recommend that you be screened regularly for cancer of the pelvic organs (ovaries, uterus, and vagina). This screening involves a pelvic examination, including checking for microscopic changes to the surface of your cervix (Pap test). You may be encouraged to have this screening done every 3 years, beginning at age 65.  For women ages 67-65, health care providers may recommend pelvic exams and Pap testing every 3 years, or they may recommend the Pap and pelvic exam, combined with testing for human papilloma virus (HPV), every 5 years. Some types of HPV increase your risk of cervical cancer. Testing for HPV may also be done on women of any age with unclear Pap test results.  Other health care providers may not recommend any screening for nonpregnant women who are considered low risk for pelvic cancer and who do not have symptoms. Ask your health care provider if a screening pelvic exam is right for you.  If you have had past treatment for cervical cancer or a condition that could lead to cancer, you need Pap tests and screening for cancer for at least 20 years after your treatment. If Pap tests have been discontinued, your risk factors (such as having a new sexual partner) need to be reassessed  to determine if screening should resume. Some women have medical problems that increase the chance of getting cervical cancer. In these cases, your health care provider may recommend more frequent screening and Pap tests. Colorectal Cancer  This type of cancer can be detected and often prevented.  Routine colorectal cancer screening usually begins at 34 years of age and continues through 34 years of age.  Your health care provider may recommend screening at an earlier age if you have risk factors for colon cancer.  Your health care provider may also recommend using home test kits to check for hidden blood in the stool.  A small camera at the end of a tube can be used to examine your colon directly (sigmoidoscopy or colonoscopy). This is done to check for the earliest forms of colorectal cancer.  Routine screening usually begins at age 91.  Direct examination of the colon should be repeated every 5-10 years through 34 years of age. However, you  may need to be screened more often if early forms of precancerous polyps or small growths are found. Skin Cancer  Check your skin from head to toe regularly.  Tell your health care provider about any new moles or changes in moles, especially if there is a change in a mole's shape or color.  Also tell your health care provider if you have a mole that is larger than the size of a pencil eraser.  Always use sunscreen. Apply sunscreen liberally and repeatedly throughout the day.  Protect yourself by wearing long sleeves, pants, a wide-brimmed hat, and sunglasses whenever you are outside. Heart disease, diabetes, and high blood pressure  High blood pressure causes heart disease and increases the risk of stroke. High blood pressure is more likely to develop in:  People who have blood pressure in the high end of the normal range (130-139/85-89 mm Hg).  People who are overweight or obese.  People who are African American.  If you are 18-39 years of  age, have your blood pressure checked every 3-5 years. If you are 65 years of age or older, have your blood pressure checked every year. You should have your blood pressure measured twice-once when you are at a hospital or clinic, and once when you are not at a hospital or clinic. Record the average of the two measurements. To check your blood pressure when you are not at a hospital or clinic, you can use:  An automated blood pressure machine at a pharmacy.  A home blood pressure monitor.  If you are between 35 years and 29 years old, ask your health care provider if you should take aspirin to prevent strokes.  Have regular diabetes screenings. This involves taking a blood sample to check your fasting blood sugar level.  If you are at a normal weight and have a low risk for diabetes, have this test once every three years after 34 years of age.  If you are overweight and have a high risk for diabetes, consider being tested at a younger age or more often. Preventing infection Hepatitis B  If you have a higher risk for hepatitis B, you should be screened for this virus. You are considered at high risk for hepatitis B if:  You were born in a country where hepatitis B is common. Ask your health care provider which countries are considered high risk.  Your parents were born in a high-risk country, and you have not been immunized against hepatitis B (hepatitis B vaccine).  You have HIV or AIDS.  You use needles to inject street drugs.  You live with someone who has hepatitis B.  You have had sex with someone who has hepatitis B.  You get hemodialysis treatment.  You take certain medicines for conditions, including cancer, organ transplantation, and autoimmune conditions. Hepatitis C  Blood testing is recommended for:  Everyone born from [birth year] through 1963.  Anyone with known risk factors for hepatitis C. Sexually transmitted infections (STIs)  You should be screened for sexually  transmitted infections (STIs) including gonorrhea and chlamydia if:  You are sexually active and are younger than 34 years of age.  You are older than 34 years of age and your health care provider tells you that you are at risk for this type of infection.  Your sexual activity has changed since you were last screened and you are at an increased risk for chlamydia or gonorrhea. Ask your health care provider if you are at risk.  If  you do not have HIV, but are at risk, it may be recommended that you take a prescription medicine daily to prevent HIV infection. This is called pre-exposure prophylaxis (PrEP). You are considered at risk if:  You are sexually active and do not regularly use condoms or know the HIV status of your partner(s).  You take drugs by injection.  You are sexually active with a partner who has HIV. Talk with your health care provider about whether you are at high risk of being infected with HIV. If you choose to begin PrEP, you should first be tested for HIV. You should then be tested every 3 months for as long as you are taking PrEP. Pregnancy  If you are premenopausal and you may become pregnant, ask your health care provider about preconception counseling.  If you may become pregnant, take 400 to 800 micrograms (mcg) of folic acid every day.  If you want to prevent pregnancy, talk to your health care provider about birth control (contraception). Osteoporosis and menopause  Osteoporosis is a disease in which the bones lose minerals and strength with aging. This can result in serious bone fractures. Your risk for osteoporosis can be identified using a bone density scan.  If you are 30 years of age or older, or if you are at risk for osteoporosis and fractures, ask your health care provider if you should be screened.  Ask your health care provider whether you should take a calcium or vitamin D supplement to lower your risk for osteoporosis.  Menopause may have certain  physical symptoms and risks.  Hormone replacement therapy may reduce some of these symptoms and risks. Talk to your health care provider about whether hormone replacement therapy is right for you. Follow these instructions at home:  Schedule regular health, dental, and eye exams.  Stay current with your immunizations.  Do not use any tobacco products including cigarettes, chewing tobacco, or electronic cigarettes.  If you are pregnant, do not drink alcohol.  If you are breastfeeding, limit how much and how often you drink alcohol.  Limit alcohol intake to no more than 1 drink per day for nonpregnant women. One drink equals 12 ounces of beer, 5 ounces of wine, or 1 ounces of hard liquor.  Do not use street drugs.  Do not share needles.  Ask your health care provider for help if you need support or information about quitting drugs.  Tell your health care provider if you often feel depressed.  Tell your health care provider if you have ever been abused or do not feel safe at home. This information is not intended to replace advice given to you by your health care provider. Make sure you discuss any questions you have with your health care provider. Document Released: 01/26/2011 Document Revised: 12/19/2015 Document Reviewed: 04/16/2015 Elsevier Interactive Patient Education  2017 [employer].

## 2016-12-17 NOTE — Assessment & Plan Note (Signed)
BMI 44. Recommend weight loss of 5-10% of current body weight through nutrition and physical activity. Encouraged a nutritional intake that is moderate, balance, and varied. Does walk significantly while at work. Encouraged to increase activity outside of work. Goal is 10,000 steps per day for about 30 minutes of moderate level activity daily. Decrease saturated fats and processed/sugary foods and emphasize protein in nutrient dense foods. Sitter weight loss program such as Weight Watchers, [employer] Patience, or Nutrisystem.

## 2016-12-22 ENCOUNTER — Telehealth: Payer: Self-pay | Admitting: Obstetrics and Gynecology

## 2016-12-22 ENCOUNTER — Encounter: Payer: Self-pay | Admitting: Obstetrics and Gynecology

## 2016-12-22 NOTE — Telephone Encounter (Signed)
Called and left a message for patient to call back to schedule a new patient doctor referral for an AEX only.

## 2016-12-23 NOTE — Telephone Encounter (Signed)
Message to referring office:  This patient did not keep her appointment with our Dr. Talbert Nan on 12/22/16. I'm routing this referral back to the referring office for follow up. Please route back to [place] when patient is ready to schedule and keep an appointment or she may just call us directly.

## 2017-01-20 DIAGNOSIS — N62 Hypertrophy of breast: Secondary | ICD-10-CM | POA: Diagnosis not present

## 2017-05-10 ENCOUNTER — Telehealth: Payer: 59 | Admitting: Family

## 2017-05-10 DIAGNOSIS — R112 Nausea with vomiting, unspecified: Secondary | ICD-10-CM | POA: Diagnosis not present

## 2017-05-10 MED ORDER — ONDANSETRON HCL 4 MG PO TABS: 4.0000 mg | ORAL_TABLET | Freq: Three times a day (TID) | ORAL | 0 refills | Status: DC | PRN

## 2017-05-10 NOTE — Progress Notes (Signed)
Thank you for the details you put in the comment boxes. Those details really help [place] take better care of you.   We are sorry that you are not feeling well. Here is how we plan to help!  Based on what you have shared with me it looks like you have a Virus that is irritating your GI tract.  Vomiting is the forceful emptying of a portion of the stomach's content through the mouth.  Although nausea and vomiting can make you feel miserable, it's important to remember that these are not diseases, but rather symptoms of an underlying illness.  When we treat short term symptoms, we always caution that any symptoms that persist should be fully evaluated in a medical office.  I have prescribed a medication that will help alleviate your symptoms and allow you to stay hydrated:  Zofran 4 mg 1 tablet every 8 hours as needed for nausea and vomiting  HOME CARE:  Drink clear liquids.  This is very important! Dehydration (the lack of fluid) can lead to a serious complication.  Start off with 1 tablespoon every 5 minutes for 8 hours.  You may begin eating bland foods after 8 hours without vomiting.  Start with saltine crackers, white bread, rice, mashed potatoes, applesauce.  After 48 hours on a bland diet, you may resume a normal diet.  Try to go to sleep.  Sleep often empties the stomach and relieves the need to vomit.  GET HELP RIGHT AWAY IF:   Your symptoms do not improve or worsen within 2 days after treatment.  You have a fever for over 3 days.  You cannot keep down fluids after trying the medication.  MAKE SURE YOU:   Understand these instructions.  Will watch your condition.  Will get help right away if you are not doing well or get worse.   Thank you for choosing an e-visit. Your e-visit answers were reviewed by a board certified advanced clinical practitioner to complete your personal care plan. Depending upon the condition, your plan could have included both over the counter or  prescription medications. Please review your pharmacy choice. Be sure that the pharmacy you have chosen is open so that you can pick up your prescription now.  If there is a problem you may message your provider in [place] to have the prescription routed to another pharmacy. Your safety is important to [place]. If you have drug allergies check your prescription carefully.  For the next 24 hours, you can use MyChart to ask questions about today's visit, request a non-urgent call back, or ask for a work or school excuse from your e-visit provider. You will get an e-mail in the next two days asking about your experience. I hope that your e-visit has been valuable and will speed your recovery.

## 2017-05-27 HISTORY — PX: BREAST SURGERY: SHX581

## 2017-06-21 ENCOUNTER — Encounter (HOSPITAL_COMMUNITY): Payer: Self-pay

## 2017-06-21 ENCOUNTER — Encounter (HOSPITAL_COMMUNITY)
Admission: RE | Admit: 2017-06-21 | Discharge: 2017-06-21 | Disposition: A | Discharge status: Home / Self Care | Payer: 59 | Source: Ambulatory Visit | Attending: Plastic Surgery | Admitting: Plastic Surgery

## 2017-06-21 ENCOUNTER — Other Ambulatory Visit: Payer: Self-pay

## 2017-06-21 ENCOUNTER — Ambulatory Visit (HOSPITAL_COMMUNITY): Payer: Self-pay | Admitting: Plastic Surgery

## 2017-06-21 DIAGNOSIS — Z01812 Encounter for preprocedural laboratory examination: Secondary | ICD-10-CM | POA: Insufficient documentation

## 2017-06-21 DIAGNOSIS — N62 Hypertrophy of breast: Secondary | ICD-10-CM | POA: Diagnosis not present

## 2017-06-21 HISTORY — DX: Personal history of urinary calculi: Z87.442

## 2017-06-21 LAB — CBC
HCT: 32.2 % — ABNORMAL LOW (ref 36.0–46.0)
Hemoglobin: 10.5 g/dL — ABNORMAL LOW (ref 12.0–15.0)
MCH: 24.8 pg — ABNORMAL LOW (ref 26.0–34.0)
MCHC: 32.6 g/dL (ref 30.0–36.0)
MCV: 76.1 fL — ABNORMAL LOW (ref 78.0–100.0)
Platelets: 249 10*3/uL (ref 150–400)
RBC: 4.23 MIL/uL (ref 3.87–5.11)
RDW: 16.4 % — ABNORMAL HIGH (ref 11.5–15.5)
WBC: 7.8 10*3/uL (ref 4.0–10.5)

## 2017-06-21 LAB — HCG, SERUM, QUALITATIVE: Preg, Serum: NEGATIVE

## 2017-06-21 NOTE — Pre-Procedure Instructions (Signed)
Max Sane Edwinna Areola  06/21/2017      [place], [place] [place] [address] Phone: [phone] Fax: [phone]  [id number] [address], [place] [address] Phone: [phone] Fax: [phone]    Your procedure is scheduled on 06/24/17  Report to [facility] at 900 A.M.  Call this number if you have problems the morning of surgery:  [phone]   Remember:  Do not eat food or drink liquids after midnight.  Take these medicines the morning of surgery with A SIP OF WATER     NONE  STOP all herbel meds, nsaids (aleve,naproxen,advil,ibuprofen)   prior to surgery starting today including all vitamins/supplements.aspirin, fish oil   Do not wear jewelry, make-up or nail polish.  Do not wear lotions, powders, or perfumes, or deoderant.  Do not shave 48 hours prior to surgery.  Men may shave face and neck.  Do not bring valuables to the hospital.  [facility] is not responsible for any belongings or valuables.  Contacts, dentures or bridgework may not be worn into surgery.  Leave your suitcase in the car.  After surgery it may be brought to your room.  For patients admitted to the hospital, discharge time will be determined by your treatment team.  Patients discharged the day of surgery will not be allowed to drive home.   Special instructions:   Special Instructions: [facility] - Preparing for Surgery  Before surgery, you can play an important role.  Because skin is not sterile, your skin needs to be as free of germs as possible.  You can reduce the number of germs on you skin by washing with CHG (chlorahexidine gluconate) soap before surgery.  CHG is an antiseptic cleaner which kills germs and bonds with the skin to continue killing germs even after washing.  Please DO NOT use if you  have an allergy to CHG or antibacterial soaps.  If your skin becomes reddened/irritated stop using the CHG and inform your nurse when you arrive at Short Stay.  Do not shave (including legs and underarms) for at least 48 hours prior to the first CHG shower.  You may shave your face.  Please follow these instructions carefully:   1.  Shower with CHG Soap the night before surgery and the morning of Surgery.  2.  If you choose to wash your hair, wash your hair first as usual with your normal shampoo.  3.  After you shampoo, rinse your hair and body thoroughly to remove the Shampoo.  4.  Use CHG as you would any other liquid soap.  You can apply chg directly  to the skin and wash gently with scrungie or a clean washcloth.  5.  Apply the CHG Soap to your body ONLY FROM THE NECK DOWN.  Do not use on open wounds or open sores.  Avoid contact with your eyes ears, mouth and genitals (private parts).  Wash genitals (private parts)       with your normal soap.  6.  Wash thoroughly, paying special attention to the area where your surgery will be performed.  7.  Thoroughly rinse your body with warm water from the neck down.  8.  DO NOT shower/wash with your normal soap after using and rinsing off the CHG Soap.  9.  Pat yourself dry with a  clean towel.            10.  Wear clean pajamas.            11.  Place clean sheets on your bed the night of your first shower and do not sleep with pets.  Day of Surgery  Do not apply any lotions/deodorants the morning of surgery.  Please wear clean clothes to the hospital/surgery center.  Please read over the  fact sheets that you were given.

## 2017-06-21 NOTE — Pre-Procedure Instructions (Signed)
Dawn Young  06/21/2017      [place], [place] - [address] [place] [address] Phone: [phone] Fax: [phone]  [id number] [address], [place] [address] Phone: [phone] Fax: [phone]    Your procedure is scheduled on 06/24/17  Report to [facility] at 900 A.M.  Call this number if you have problems the morning of surgery:  [phone]   Remember:  Do not eat food or drink liquids after midnight.  Take these medicines the morning of surgery with A SIP OF WATER     NONE  STOP all herbel meds, nsaids (aleve,naproxen,advil,ibuprofen)   prior to surgery starting today including all vitamins/supplements.aspirin, fish oil   Do not wear jewelry, make-up or nail polish.  Do not wear lotions, powders, or perfumes, or deoderant.  Do not shave 48 hours prior to surgery.  Men may shave face and neck.  Do not bring valuables to the hospital.  [facility] is not responsible for any belongings or valuables.  Contacts, dentures or bridgework may not be worn into surgery.  Leave your suitcase in the car.  After surgery it may be brought to your room.  For patients admitted to the hospital, discharge time will be determined by your treatment team.  Patients discharged the day of surgery will not be allowed to drive home.   Special instructions:   Special Instructions: [facility] - Preparing for Surgery  Before surgery, you can play an important role.  Because skin is not sterile, your skin needs to be as free of germs as possible.  You can reduce the number of germs on you skin by washing with CHG (chlorahexidine gluconate) soap before surgery.  CHG is an antiseptic cleaner which kills germs and bonds with the skin to continue killing germs even after washing.  Please DO NOT use if you  have an allergy to CHG or antibacterial soaps.  If your skin becomes reddened/irritated stop using the CHG and inform your nurse when you arrive at Short Stay.  Do not shave (including legs and underarms) for at least 48 hours prior to the first CHG shower.  You may shave your face.  Please follow these instructions carefully:   1.  Shower with CHG Soap the night before surgery and the morning of Surgery.  2.  If you choose to wash your hair, wash your hair first as usual with your normal shampoo.  3.  After you shampoo, rinse your hair and body thoroughly to remove the Shampoo.  4.  Use CHG as you would any other liquid soap.  You can apply chg directly  to the skin and wash gently with scrungie or a clean washcloth.  5.  Apply the CHG Soap to your body ONLY FROM THE NECK DOWN.  Do not use on open wounds or open sores.  Avoid contact with your eyes ears, mouth and genitals (private parts).  Wash genitals (private parts)       with your normal soap.  6.  Wash thoroughly, paying special attention to the area where your surgery will be performed.  7.  Thoroughly rinse your body with warm water from the neck down.  8.  DO NOT shower/wash with your normal soap after using and rinsing off the CHG Soap.  9.  Pat yourself dry with a  clean towel.            10.  Wear clean pajamas.            11.  Place clean sheets on your bed the night of your first shower and do not sleep with pets.  Day of Surgery  Do not apply any lotions/deodorants the morning of surgery.  Please wear clean clothes to the hospital/surgery center.  Please read over the  fact sheets that you were given.

## 2017-06-23 MED ORDER — HEPARIN SODIUM (PORCINE) 5000 UNIT/ML IJ SOLN
5000.0000 [IU] | INTRAMUSCULAR | Status: AC
  Administered 2017-06-24: 5000 [IU] via SUBCUTANEOUS
  Filled 2017-06-23: qty 1

## 2017-06-23 MED ORDER — DEXTROSE 5 % IV SOLN
3.0000 g | INTRAVENOUS | Status: AC
  Administered 2017-06-24: 3 g via INTRAVENOUS
  Filled 2017-06-23: qty 3000

## 2017-06-24 ENCOUNTER — Ambulatory Visit (HOSPITAL_COMMUNITY): Payer: 59 | Admitting: Vascular Surgery

## 2017-06-24 ENCOUNTER — Encounter (HOSPITAL_COMMUNITY)
Admission: RE | Disposition: A | Discharge status: Home / Self Care | Payer: Self-pay | Source: Ambulatory Visit | Attending: Plastic Surgery

## 2017-06-24 ENCOUNTER — Ambulatory Visit (HOSPITAL_COMMUNITY): Payer: 59 | Admitting: Certified Registered Nurse Anesthetist

## 2017-06-24 ENCOUNTER — Ambulatory Visit (HOSPITAL_COMMUNITY)
Admission: RE | Admit: 2017-06-24 | Discharge: 2017-06-25 | Disposition: A | Discharge status: Home / Self Care | Payer: 59 | Source: Ambulatory Visit | Attending: Plastic Surgery | Admitting: Plastic Surgery

## 2017-06-24 ENCOUNTER — Encounter (HOSPITAL_COMMUNITY): Payer: Self-pay | Admitting: *Deleted

## 2017-06-24 DIAGNOSIS — N62 Hypertrophy of breast: Secondary | ICD-10-CM | POA: Diagnosis present

## 2017-06-24 DIAGNOSIS — D649 Anemia, unspecified: Secondary | ICD-10-CM | POA: Insufficient documentation

## 2017-06-24 DIAGNOSIS — D509 Iron deficiency anemia, unspecified: Secondary | ICD-10-CM | POA: Diagnosis not present

## 2017-06-24 DIAGNOSIS — E559 Vitamin D deficiency, unspecified: Secondary | ICD-10-CM | POA: Diagnosis not present

## 2017-06-24 DIAGNOSIS — M546 Pain in thoracic spine: Secondary | ICD-10-CM | POA: Diagnosis not present

## 2017-06-24 DIAGNOSIS — F17200 Nicotine dependence, unspecified, uncomplicated: Secondary | ICD-10-CM | POA: Diagnosis not present

## 2017-06-24 DIAGNOSIS — Z79899 Other long term (current) drug therapy: Secondary | ICD-10-CM | POA: Insufficient documentation

## 2017-06-24 HISTORY — PX: BREAST REDUCTION SURGERY: SHX8

## 2017-06-24 SURGERY — Surgical Case
Anesthesia: *Unknown

## 2017-06-24 SURGERY — MAMMOPLASTY, REDUCTION
Anesthesia: General | Site: Breast | Laterality: Bilateral

## 2017-06-24 MED ORDER — SUCCINYLCHOLINE CHLORIDE 20 MG/ML IJ SOLN
INTRAMUSCULAR | Status: DC | PRN
  Administered 2017-06-24: 140 mg via INTRAVENOUS

## 2017-06-24 MED ORDER — DOCUSATE SODIUM 100 MG PO CAPS
100.0000 mg | ORAL_CAPSULE | Freq: Every day | ORAL | Status: DC
  Administered 2017-06-24: 100 mg via ORAL
  Filled 2017-06-24: qty 1

## 2017-06-24 MED ORDER — FENTANYL CITRATE (PF) 250 MCG/5ML IJ SOLN
INTRAMUSCULAR | Status: AC
  Filled 2017-06-24: qty 5

## 2017-06-24 MED ORDER — DEXAMETHASONE SODIUM PHOSPHATE 10 MG/ML IJ SOLN
INTRAMUSCULAR | Status: DC | PRN
  Administered 2017-06-24: 10 mg via INTRAVENOUS

## 2017-06-24 MED ORDER — HYDROMORPHONE HCL 2 MG PO TABS
2.0000 mg | ORAL_TABLET | ORAL | Status: DC | PRN
  Administered 2017-06-24 – 2017-06-25 (×3): 2 mg via ORAL
  Filled 2017-06-24 (×3): qty 1

## 2017-06-24 MED ORDER — FENTANYL CITRATE (PF) 250 MCG/5ML IJ SOLN
INTRAMUSCULAR | Status: AC
Start: 2017-06-24 — End: ?
  Filled 2017-06-24: qty 5

## 2017-06-24 MED ORDER — HYDROMORPHONE HCL 1 MG/ML IJ SOLN
0.2500 mg | INTRAMUSCULAR | Status: DC | PRN
  Administered 2017-06-24 (×4): 0.5 mg via INTRAVENOUS

## 2017-06-24 MED ORDER — OXYCODONE HCL 5 MG PO TABS
5.0000 mg | ORAL_TABLET | Freq: Once | ORAL | Status: DC | PRN
Start: 2017-06-24 — End: 2017-06-24

## 2017-06-24 MED ORDER — MIDAZOLAM HCL 2 MG/2ML IJ SOLN
INTRAMUSCULAR | Status: AC
  Filled 2017-06-24: qty 2

## 2017-06-24 MED ORDER — LACTATED RINGERS IV SOLN
INTRAVENOUS | Status: DC
  Administered 2017-06-24: 10:00:00 via INTRAVENOUS

## 2017-06-24 MED ORDER — PROPOFOL 10 MG/ML IV BOLUS
INTRAVENOUS | Status: AC
  Filled 2017-06-24: qty 20

## 2017-06-24 MED ORDER — LIDOCAINE HCL (CARDIAC) 20 MG/ML IV SOLN
INTRAVENOUS | Status: DC | PRN
  Administered 2017-06-24: 60 mg via INTRAVENOUS

## 2017-06-24 MED ORDER — FENTANYL CITRATE (PF) 100 MCG/2ML IJ SOLN
INTRAMUSCULAR | Status: DC | PRN
  Administered 2017-06-24: 100 ug via INTRAVENOUS
  Administered 2017-06-24 (×3): 50 ug via INTRAVENOUS
  Administered 2017-06-24: 100 ug via INTRAVENOUS
  Administered 2017-06-24: 150 ug via INTRAVENOUS
  Administered 2017-06-24: 100 ug via INTRAVENOUS
  Administered 2017-06-24 (×3): 50 ug via INTRAVENOUS

## 2017-06-24 MED ORDER — PROMETHAZINE HCL 25 MG/ML IJ SOLN
6.2500 mg | INTRAMUSCULAR | Status: DC | PRN
  Administered 2017-06-24: 6.25 mg via INTRAVENOUS
  Filled 2017-06-24: qty 1

## 2017-06-24 MED ORDER — OXYCODONE HCL 5 MG/5ML PO SOLN: 5.0000 mg | Freq: Once | ORAL | Status: DC | PRN

## 2017-06-24 MED ORDER — LABETALOL HCL 5 MG/ML IV SOLN
INTRAVENOUS | Status: DC | PRN
  Administered 2017-06-24: 5 mg via INTRAVENOUS

## 2017-06-24 MED ORDER — ACETAMINOPHEN 10 MG/ML IV SOLN
INTRAVENOUS | Status: AC
  Filled 2017-06-24: qty 100

## 2017-06-24 MED ORDER — HYDROMORPHONE HCL 1 MG/ML IJ SOLN
INTRAMUSCULAR | Status: AC
  Administered 2017-06-24: 0.5 mg via INTRAVENOUS
  Filled 2017-06-24: qty 1

## 2017-06-24 MED ORDER — ENOXAPARIN SODIUM 40 MG/0.4ML ~~LOC~~ SOLN
40.0000 mg | SUBCUTANEOUS | Status: DC
  Administered 2017-06-25: 40 mg via SUBCUTANEOUS
  Filled 2017-06-24: qty 0.4

## 2017-06-24 MED ORDER — 0.9 % SODIUM CHLORIDE (POUR BTL) OPTIME
TOPICAL | Status: DC | PRN
  Administered 2017-06-24 (×3): 1000 mL

## 2017-06-24 MED ORDER — METHOCARBAMOL 500 MG PO TABS
500.0000 mg | ORAL_TABLET | Freq: Four times a day (QID) | ORAL | Status: DC | PRN
  Administered 2017-06-24 – 2017-06-25 (×3): 500 mg via ORAL
  Filled 2017-06-24 (×3): qty 1

## 2017-06-24 MED ORDER — CHLORHEXIDINE GLUCONATE CLOTH 2 % EX PADS: 6.0000 | MEDICATED_PAD | Freq: Once | CUTANEOUS | Status: DC

## 2017-06-24 MED ORDER — SUGAMMADEX SODIUM 200 MG/2ML IV SOLN
INTRAVENOUS | Status: DC | PRN
  Administered 2017-06-24: 200 mg via INTRAVENOUS

## 2017-06-24 MED ORDER — HYDROMORPHONE HCL 1 MG/ML IJ SOLN
INTRAMUSCULAR | Status: AC
  Filled 2017-06-24: qty 1

## 2017-06-24 MED ORDER — ONDANSETRON HCL 4 MG/2ML IJ SOLN
INTRAMUSCULAR | Status: DC | PRN
  Administered 2017-06-24: 4 mg via INTRAVENOUS

## 2017-06-24 MED ORDER — ROCURONIUM BROMIDE 100 MG/10ML IV SOLN
INTRAVENOUS | Status: DC | PRN
  Administered 2017-06-24: 60 mg via INTRAVENOUS
  Administered 2017-06-24: 20 mg via INTRAVENOUS

## 2017-06-24 MED ORDER — MIDAZOLAM HCL 5 MG/5ML IJ SOLN
INTRAMUSCULAR | Status: DC | PRN
  Administered 2017-06-24: 2 mg via INTRAVENOUS

## 2017-06-24 MED ORDER — CEFAZOLIN SODIUM-DEXTROSE 1-4 GM/50ML-% IV SOLN
1.0000 g | Freq: Four times a day (QID) | INTRAVENOUS | Status: DC
  Administered 2017-06-24 – 2017-06-25 (×3): 1 g via INTRAVENOUS
  Filled 2017-06-24 (×4): qty 50

## 2017-06-24 MED ORDER — ACETAMINOPHEN 10 MG/ML IV SOLN
INTRAVENOUS | Status: DC | PRN
  Administered 2017-06-24: 1000 mg via INTRAVENOUS

## 2017-06-24 MED ORDER — DEXTROSE-NACL 5-0.45 % IV SOLN
INTRAVENOUS | Status: DC
  Administered 2017-06-24 – 2017-06-25 (×2): via INTRAVENOUS

## 2017-06-24 MED ORDER — ONDANSETRON HCL 4 MG/2ML IJ SOLN: 4.0000 mg | Freq: Four times a day (QID) | INTRAMUSCULAR | Status: DC | PRN

## 2017-06-24 MED ORDER — PROPOFOL 10 MG/ML IV BOLUS
INTRAVENOUS | Status: DC | PRN
  Administered 2017-06-24: 200 mg via INTRAVENOUS

## 2017-06-24 MED ORDER — LACTATED RINGERS IV SOLN
INTRAVENOUS | Status: DC | PRN
  Administered 2017-06-24 (×2): via INTRAVENOUS

## 2017-06-24 MED ORDER — FERROUS SULFATE 325 (65 FE) MG PO TABS: 325.0000 mg | ORAL_TABLET | Freq: Every day | ORAL | Status: DC

## 2017-06-24 SURGICAL SUPPLY — 53 items
ATCH SMKEVC FLXB CAUT HNDSWH (FILTER) ×1 IMPLANT
BINDER BREAST XXLRG (GAUZE/BANDAGES/DRESSINGS) ×2 IMPLANT
CANISTER SUCT 3000ML PPV (MISCELLANEOUS) ×2 IMPLANT
CHLORAPREP W/TINT 26ML (MISCELLANEOUS) ×4 IMPLANT
COTTONBALL LRG STERILE PKG (GAUZE/BANDAGES/DRESSINGS) ×4 IMPLANT
COVER SURGICAL LIGHT HANDLE (MISCELLANEOUS) ×2 IMPLANT
DERMABOND ADVANCED (GAUZE/BANDAGES/DRESSINGS) ×1
DERMABOND ADVANCED .7 DNX12 (GAUZE/BANDAGES/DRESSINGS) ×1 IMPLANT
DRAIN CHANNEL 19F RND (DRAIN) ×4 IMPLANT
DRAPE HALF SHEET 40X57 (DRAPES) ×4 IMPLANT
DRAPE ORTHO SPLIT 77X108 STRL (DRAPES) ×2
DRAPE SURG ORHT 6 SPLT 77X108 (DRAPES) ×2 IMPLANT
DRAPE WARM FLUID 44X44 (DRAPE) ×2 IMPLANT
ELECT CAUTERY BLADE 6.4 (BLADE) ×2 IMPLANT
ELECT REM PT RETURN 9FT ADLT (ELECTROSURGICAL) ×2
ELECTRODE REM PT RTRN 9FT ADLT (ELECTROSURGICAL) ×1 IMPLANT
EVACUATOR SILICONE 100CC (DRAIN) ×4 IMPLANT
EVACUATOR SMOKE ACCUVAC VALLEY (FILTER) ×1
GAUZE SPONGE 4X4 12PLY STRL (GAUZE/BANDAGES/DRESSINGS) ×2 IMPLANT
GAUZE XEROFORM 5X9 LF (GAUZE/BANDAGES/DRESSINGS) ×2 IMPLANT
GLOVE BIO SURGEON STRL SZ 6.5 (GLOVE) ×2 IMPLANT
GLOVE BIO SURGEON STRL SZ7.5 (GLOVE) ×2 IMPLANT
GLOVE BIOGEL PI IND STRL 6.5 (GLOVE) ×1 IMPLANT
GLOVE BIOGEL PI IND STRL 7.5 (GLOVE) ×1 IMPLANT
GLOVE BIOGEL PI IND STRL 8 (GLOVE) ×1 IMPLANT
GLOVE BIOGEL PI IND STRL 8.5 (GLOVE) ×3 IMPLANT
GLOVE BIOGEL PI INDICATOR 6.5 (GLOVE) ×1
GLOVE BIOGEL PI INDICATOR 7.5 (GLOVE) ×1
GLOVE BIOGEL PI INDICATOR 8 (GLOVE) ×1
GLOVE BIOGEL PI INDICATOR 8.5 (GLOVE) ×3
GLOVE ECLIPSE 7.5 STRL STRAW (GLOVE) ×2 IMPLANT
GLOVE ECLIPSE 8.0 STRL XLNG CF (GLOVE) ×6 IMPLANT
GLOVE SURG SS PI 6.5 STRL IVOR (GLOVE) ×2 IMPLANT
GOWN STRL REUS W/ TWL LRG LVL3 (GOWN DISPOSABLE) ×3 IMPLANT
GOWN STRL REUS W/ TWL XL LVL3 (GOWN DISPOSABLE) ×3 IMPLANT
GOWN STRL REUS W/TWL LRG LVL3 (GOWN DISPOSABLE) ×3
GOWN STRL REUS W/TWL XL LVL3 (GOWN DISPOSABLE) ×3
KIT BASIN OR (CUSTOM PROCEDURE TRAY) ×2 IMPLANT
KIT ROOM TURNOVER OR (KITS) ×2 IMPLANT
MARKER SKIN DUAL TIP RULER LAB (MISCELLANEOUS) ×2 IMPLANT
NS IRRIG 1000ML POUR BTL (IV SOLUTION) ×6 IMPLANT
PACK GENERAL/GYN (CUSTOM PROCEDURE TRAY) ×2 IMPLANT
PAD ABD 8X10 STRL (GAUZE/BANDAGES/DRESSINGS) ×2 IMPLANT
PAD ARMBOARD 7.5X6 YLW CONV (MISCELLANEOUS) ×2 IMPLANT
PREFILTER EVAC NS 1 1/3-3/8IN (MISCELLANEOUS) ×2 IMPLANT
SPONGE LAP 18X18 X RAY DECT (DISPOSABLE) ×4 IMPLANT
SUT MNCRL AB 3-0 PS2 18 (SUTURE) ×10 IMPLANT
SUT MNCRL AB 4-0 PS2 18 (SUTURE) ×4 IMPLANT
SUT MON AB 2-0 CT1 36 (SUTURE) ×10 IMPLANT
SUT PROLENE 5 0 PS 2 (SUTURE) ×4 IMPLANT
SUT SILK 4 0 PS 2 (SUTURE) ×12 IMPLANT
TOWEL GREEN STERILE FF (TOWEL DISPOSABLE) ×2 IMPLANT
TUBE CONNECTING 12X1/4 (SUCTIONS) ×2 IMPLANT

## 2017-06-24 NOTE — Transfer of Care (Signed)
Immediate Anesthesia Transfer of Care Note  Patient: Dawn Young  Procedure(s) Performed: MAMMARY REDUCTION  (BREAST) (Bilateral Breast)  Patient Location: PACU  Anesthesia Type:General  Level of Consciousness: awake  Airway & Oxygen Therapy: Patient Spontanous Breathing and Patient connected to nasal cannula oxygen  Post-op Assessment: Report given to RN and Post -op Vital signs reviewed and stable  Post vital signs: Reviewed and stable  Last Vitals:  Vitals:   06/24/17 0925 06/24/17 1510  BP: (!) 161/95   Pulse: 71   Resp: 18   Temp: 37.1 C 36.9 C  SpO2: 100%     Last Pain:  Vitals:   06/24/17 1510  TempSrc:   PainSc: 0-No pain         Complications: No apparent anesthesia complications

## 2017-06-24 NOTE — Anesthesia Procedure Notes (Signed)
Procedure Name: Intubation Date/Time: 06/24/2017 12:16 PM Performed by: Gayland Curry, CRNA Pre-anesthesia Checklist: Patient identified, Emergency Drugs available, Suction available and Patient being monitored Patient Re-evaluated:Patient Re-evaluated prior to induction Oxygen Delivery Method: Circle system utilized Preoxygenation: Pre-oxygenation with 100% oxygen Induction Type: IV induction and Rapid sequence Laryngoscope Size: Mac and 3 Grade View: Grade I Tube type: Oral Tube size: 7.0 mm Number of attempts: 1 Placement Confirmation: ETT inserted through vocal cords under direct vision,  positive ETCO2 and breath sounds checked- equal and bilateral Secured at: 21 cm Tube secured with: Tape Dental Injury: Teeth and Oropharynx as per pre-operative assessment

## 2017-06-24 NOTE — Anesthesia Preprocedure Evaluation (Signed)
Anesthesia Evaluation  Patient identified by MRN, date of birth, ID band Patient awake    Reviewed: Allergy & Precautions, H&P , NPO status , Patient's Chart, lab work & pertinent test results  Airway Mallampati: II   Neck ROM: full    Dental   Pulmonary Current Smoker,    breath sounds clear to auscultation       Cardiovascular negative cardio ROS   Rhythm:regular Rate:Normal     Neuro/Psych    GI/Hepatic   Endo/Other  Morbid obesity  Renal/GU      Musculoskeletal   Abdominal   Peds  Hematology  (+) Blood dyscrasia, anemia ,   Anesthesia Other Findings   Reproductive/Obstetrics                             Anesthesia Physical Anesthesia Plan  ASA: II  Anesthesia Plan: General   Post-op Pain Management:    Induction: Intravenous  PONV Risk Score and Plan: 2 and Ondansetron, Dexamethasone, Midazolam and Treatment may vary due to age or medical condition  Airway Management Planned: Oral ETT  Additional Equipment:   Intra-op Plan:   Post-operative Plan: Extubation in OR  Informed Consent: I have reviewed the patients History and Physical, chart, labs and discussed the procedure including the risks, benefits and alternatives for the proposed anesthesia with the patient or authorized representative who has indicated his/her understanding and acceptance.     Plan Discussed with: CRNA, Anesthesiologist and Surgeon  Anesthesia Plan Comments:         Anesthesia Quick Evaluation

## 2017-06-24 NOTE — Progress Notes (Signed)
Patient arrived to 6N17 A&Ox4, VSS, IV intact and infusing.  Noted to have 2 jp drains and breast binder.  Mother at bedside.  Patient states pain is tolerable at this time.  Patient and mother oriented to room and equipment.  Will continue to monitor.

## 2017-06-24 NOTE — H&P (Signed)
I have re-evaluated and re-examined the patient and there are no changes.  See office notes in paper chart for H&P. 

## 2017-06-24 NOTE — Brief Op Note (Signed)
06/24/2017  3:07 PM  PATIENT:  Pollyann Glen  36 y.o. female  PRE-OPERATIVE DIAGNOSIS:  BREAST HYPERTROPHY  POST-OPERATIVE DIAGNOSIS:  BREAST HYPERTROPHY  PROCEDURE:  Procedure(s): MAMMARY REDUCTION  (BREAST) (Bilateral)  SURGEON:  Surgeon(s) and Role:    Crissie Reese, MD - Primary  PHYSICIAN ASSISTANT:   ASSISTANTS: Judyann Munson, RNFA   ANESTHESIA:   general  EBL:  250 mL   BLOOD ADMINISTERED:none  DRAINS: (1) Jackson-Pratt drain(s) with closed bulb suction in the left and right breast   LOCAL MEDICATIONS USED:  NONE  SPECIMEN:  Source of Specimen:  Bilateral breast tissue  DISPOSITION OF SPECIMEN:  PATHOLOGY  COUNTS:  YES  TOURNIQUET:  * No tourniquets in log *  DICTATION: .Other Dictation: Dictation Number [id number]  PLAN OF CARE: Admit for overnight observation  PATIENT DISPOSITION:  PACU - hemodynamically stable.   Delay start of Pharmacological VTE agent (>24hrs) due to surgical blood loss or risk of bleeding: no

## 2017-06-25 ENCOUNTER — Encounter (HOSPITAL_COMMUNITY): Payer: Self-pay | Admitting: Plastic Surgery

## 2017-06-25 DIAGNOSIS — N62 Hypertrophy of breast: Secondary | ICD-10-CM | POA: Diagnosis not present

## 2017-06-25 DIAGNOSIS — F17200 Nicotine dependence, unspecified, uncomplicated: Secondary | ICD-10-CM | POA: Diagnosis not present

## 2017-06-25 DIAGNOSIS — D649 Anemia, unspecified: Secondary | ICD-10-CM | POA: Diagnosis not present

## 2017-06-25 DIAGNOSIS — Z79899 Other long term (current) drug therapy: Secondary | ICD-10-CM | POA: Diagnosis not present

## 2017-06-25 NOTE — Discharge Summary (Signed)
Physician Discharge Summary  Patient ID: Dawn Young MRN: [id number] DOB/AGE: 04/16/1981 36 y.o.  Admit date: 06/24/2017 Discharge date: 06/25/2017  Admission Diagnoses: Bilateral breast hypertrophy  Discharge Diagnoses: Same Active Problems:   Breast hypertrophy in female   Discharged Condition: good  Hospital Course: On the day of admission the patient was taken to surgery and had bilateral breast reduction with free nipple grafts. The patient tolerated the procedures well. Postoperatively he patient was ambulatory and tolerating diet on the first postoperative day. She has good pain control and is ready for discharge.  Treatments: antibiotics: Ancef, anticoagulation: LMW heparin and surgery: bilateral breast reduction  Discharge Exam: Blood pressure (!) 159/92, pulse 61, temperature 98.4 F (36.9 C), temperature source Oral, resp. rate 18, height 5\' 5"  (1.651 Young), weight 288 lb 4.8 oz (130.8 kg), SpO2 98 %.  Operative sites: Breasts soft bilateral. No evidence of bleeding. Drains functioning with thin drainage. Drains removed this am.  Disposition: 01-Home or Self Care   Allergies as of 06/25/2017   No Known Allergies     Medication List    TAKE these medications   Iron 325 (65 Fe) MG Tabs Take 1 tablet by mouth daily.        SignedHarlow Young, Dawn Young 06/25/2017, 8:27 AM

## 2017-06-25 NOTE — Anesthesia Postprocedure Evaluation (Signed)
Anesthesia Post Note  Patient: Dawn Young  Procedure(s) Performed: MAMMARY REDUCTION  (BREAST) (Bilateral Breast)     Patient location during evaluation: PACU Anesthesia Type: General Level of consciousness: awake and alert Pain management: pain level controlled Vital Signs Assessment: post-procedure vital signs reviewed and stable Respiratory status: spontaneous breathing, nonlabored ventilation, respiratory function stable and patient connected to nasal cannula oxygen Cardiovascular status: blood pressure returned to baseline and stable Postop Assessment: no apparent nausea or vomiting Anesthetic complications: no    Last Vitals:  Vitals:   06/25/17 0226 06/25/17 0537  BP: (!) 157/88 (!) 159/92  Pulse: 68 61  Resp: 18 18  Temp: 36.9 C 36.9 C  SpO2: 99% 98%    Last Pain:  Vitals:   06/25/17 0537  TempSrc: Oral  PainSc:    Pain Goal: Patients Stated Pain Goal: 2 (06/24/17 2100)               [place] S

## 2017-06-25 NOTE — Op Note (Signed)
NAME:  Dawn Young, HARBOUR                  ACCOUNT NO.:  MEDICAL RECORD NO.:  [id number]  LOCATION:                                 FACILITY:  PHYSICIAN:  Crissie Reese, M.D.          DATE OF BIRTH:  DATE OF PROCEDURE:  06/24/2017 DATE OF DISCHARGE:                              OPERATIVE REPORT   PREOPERATIVE DIAGNOSIS:  Bilateral macromastia.  POSTOPERATIVE DIAGNOSIS:  Bilateral macromastia.  PROCEDURE PERFORMED:  Bilateral breast reduction using free nipple graft technique.  SURGEON:  Crissie Reese, M.D.  ASSISTANT:  Judyann Munson, RNFA.  ANESTHESIA:  General.  ESTIMATED BLOOD LOSS:  250 mL.  DRAINS:  One 19-French on each side.  CLINICAL NOTE:  This 36 year old woman complains of upper back pain, neck pain, shoulder pain, and bra strap shoulder grooving, and large breasts.  She requested a breast reduction.  It was felt that a free nipple graft technique with the best method.  The nature of the procedure and risks were discussed with her in great detail.  These risks included, but are not limited to, bleeding, infection, healing problems, scarring, fluid accumulations, anesthesia-related complications, DVT, PE, loss of sensation in the nipples, loss of sensation generally over the breast, loss of the nipple grafts, healing problems, chronic pain, failure to relieve symptoms, asymmetry, and overall disappointment.  She understood all this and wished to proceed.  DESCRIPTION OF PROCEDURE:  The patient was marked in the holding area in a full standing position.  She was then taken to the operating room and placed supine.  After successful induction of general anesthesia, she was prepped with ChloraPrep and after waiting 3 minutes for drying, she was draped with sterile drapes.  With the 38 mm marker, marked the nipple complex and this was harvested and labeled left and right in separate moist lap sponges.  The incisions were then made and the dissection was then  carried out from the lower incision in a cephalad direction.  The excision of the excess breast tissue was then performed and a total of 1581 g resected from the smaller right side and 1809 g from the larger left side.  Thorough irrigation with saline and meticulous hemostasis was achieved using electrocautery.  After hemostasis had been confirmed, a 19-French drain was positioned on each side, brought out through the lateral aspect of the wound and the closure with 2-0 Monocryl interrupted inverted deep dermal sutures and running 3-0 Monocryl subcuticular suture.  A measurement was then taken 6 cm up from the inframammary crease and the 38 mm marker marked the nipple-areolar complex.  This site was deepithelialized.  The nipple grafts were then prepared, defatting with great care, and the nipple grafts were then applied to these deepithelialized beds.  The nipple complexes were then secured around the periphery using 4-0 silk simple interrupted sutures, leaving 1 tail long for the tie-over bolster dressing, and then a 6-0 Prolene simple running suture around the periphery.  Irrigation was performed prior to the placement of the Prolene.  Irrigation was performed underneath the nipple grafts.  A bolster was then fashioned using Xeroform gauze and cotton balls soaked in saline, and  the sutures tied over these.  Dermabond, dry sterile dressings, and a circumferential Ace wrap as well as the breast vest were positioned, and she was transferred to the recovery room in stable, having tolerated the procedure well.  DISPOSITION:  She will be observed overnight.     Crissie Reese, M.D.     DB/MEDQ  D:  06/24/2017  T:  06/25/2017  Job:  [id number]  cc:   Crissie Reese, M.D.

## 2017-06-25 NOTE — Plan of Care (Signed)
  Education: Knowledge of General Education information will improve 06/25/2017 0516 - Progressing by Renata Caprice, RN

## 2017-06-25 NOTE — Discharge Instructions (Addendum)
No lifting for 6 weeks No vigorous activity for 6 weeks (including outdoor walks) No driving for 4 weeks OK to walk up stairs slowly Stay propped up Use incentive spirometer at home every hour while awake No shower  Take an over-the-counter stool softener (such as Colace) while on pain medication Continue iron See Dr. Harlow Mares in the office next week as scheduled For questions call [phone] or [phone]

## 2017-06-25 NOTE — Discharge Planning (Signed)
Patient discharged home in stable condition. Verbalizes understanding of all discharge instructions, including home medications and follow up appointments. 

## 2017-09-17 ENCOUNTER — Telehealth: Payer: Self-pay

## 2017-09-17 ENCOUNTER — Ambulatory Visit (INDEPENDENT_AMBULATORY_CARE_PROVIDER_SITE_OTHER): Payer: No Typology Code available for payment source

## 2017-09-17 ENCOUNTER — Encounter: Payer: Self-pay | Admitting: Emergency Medicine

## 2017-09-17 ENCOUNTER — Ambulatory Visit (INDEPENDENT_AMBULATORY_CARE_PROVIDER_SITE_OTHER): Payer: No Typology Code available for payment source | Admitting: Emergency Medicine

## 2017-09-17 ENCOUNTER — Other Ambulatory Visit: Payer: Self-pay

## 2017-09-17 VITALS — BP 110/62 | HR 83 | Temp 98.3°F | Resp 16 | Ht 65.5 in | Wt 280.4 lb

## 2017-09-17 DIAGNOSIS — S39012A Strain of muscle, fascia and tendon of lower back, initial encounter: Secondary | ICD-10-CM

## 2017-09-17 DIAGNOSIS — M545 Low back pain, unspecified: Secondary | ICD-10-CM

## 2017-09-17 DIAGNOSIS — M25562 Pain in left knee: Secondary | ICD-10-CM | POA: Diagnosis not present

## 2017-09-17 DIAGNOSIS — G8929 Other chronic pain: Secondary | ICD-10-CM | POA: Insufficient documentation

## 2017-09-17 DIAGNOSIS — M25561 Pain in right knee: Secondary | ICD-10-CM | POA: Diagnosis not present

## 2017-09-17 MED ORDER — DICLOFENAC SODIUM 75 MG PO TBEC: 75.0000 mg | DELAYED_RELEASE_TABLET | Freq: Two times a day (BID) | ORAL | 0 refills | Status: AC

## 2017-09-17 MED ORDER — CYCLOBENZAPRINE HCL 10 MG PO TABS
10.0000 mg | ORAL_TABLET | Freq: Every day | ORAL | 0 refills | Status: DC
Start: 2017-09-17 — End: 2017-11-08

## 2017-09-17 MED FILL — CYCLOBENZAPRINE 10 MG TAB: 10 | 30 days supply | Qty: 30 | Fill #0

## 2017-09-17 MED FILL — DICLOFENAC SODIUM 75 MG TAB: 75 | 15 days supply | Qty: 30 | Fill #0

## 2017-09-17 NOTE — Patient Instructions (Addendum)
     IF you received an x-ray today, you will receive an invoice from [facility]. Please contact [facility] at [phone] with questions or concerns regarding your invoice.   IF you received labwork today, you will receive an invoice from [facility]. Please contact [facility] at [phone] with questions or concerns regarding your invoice.   Our billing staff will not be able to assist you with questions regarding bills from these companies.  You will be contacted with the lab results as soon as they are available. The fastest way to get your results is to activate your My Chart account. Instructions are located on the last page of this paperwork. If you have not heard from us regarding the results in 2 weeks, please contact this office.      Back Pain, Adult Back pain is very common. The pain often gets better over time. The cause of back pain is usually not dangerous. Most people can learn to manage their back pain on their own. Follow these instructions at home: Watch your back pain for any changes. The following actions may help to lessen any pain you are feeling:  Stay active. Start with short walks on flat ground if you can. Try to walk farther each day.  Exercise regularly as told by your doctor. Exercise helps your back heal faster. It also helps avoid future injury by keeping your muscles strong and flexible.  Do not sit, drive, or stand in one place for more than 30 minutes.  Do not stay in bed. Resting more than 1-2 days can slow down your recovery.  Be careful when you bend or lift an object. Use good form when lifting: ? Bend at your knees. ? Keep the object close to your body. ? Do not twist.  Sleep on a firm mattress. Lie on your side, and bend your knees. If you lie on your back, put a pillow under your knees.  Take medicines only as told by your doctor.  Put ice on the injured area. ? Put ice in a plastic bag. ? Place a towel between your  skin and the bag. ? Leave the ice on for 20 minutes, 2-3 times a day for the first 2-3 days. After that, you can switch between ice and heat packs.  Avoid feeling anxious or stressed. Find good ways to deal with stress, such as exercise.  Maintain a healthy weight. Extra weight puts stress on your back.  Contact a doctor if:  You have pain that does not go away with rest or medicine.  You have worsening pain that goes down into your legs or buttocks.  You have pain that does not get better in one week.  You have pain at night.  You lose weight.  You have a fever or chills. Get help right away if:  You cannot control when you poop (bowel movement) or pee (urinate).  Your arms or legs feel weak.  Your arms or legs lose feeling (numbness).  You feel sick to your stomach (nauseous) or throw up (vomit).  You have belly (abdominal) pain.  You feel like you may pass out (faint). This information is not intended to replace advice given to you by your health care provider. Make sure you discuss any questions you have with your health care provider. Document Released: 12/30/2007 Document Revised: 12/19/2015 Document Reviewed: 11/14/2013 Elsevier Interactive Patient Education  2018 Elsevier Inc.  

## 2017-09-17 NOTE — Telephone Encounter (Signed)
Copied from [place] [id number]. Topic: Quick Communication - See Telephone Encounter >> Sep 17, 2017 12:09 PM Hewitt Shorts wrote: CRM for notification. See Telephone encounter for:  Dawn Young with [facility] out patient pharmacy is calling for the diclofenac take one twice a day for 5 days with a quanity of 30 is this correct  Best number [phone] 09/17/17.

## 2017-09-17 NOTE — Progress Notes (Signed)
Dawn Young 37 y.o.   Chief Complaint  Patient presents with  . Back Pain    per patient since breast reduction 05/2017  . Knee Pain    BOTH about the same time 11 /2018    HISTORY OF PRESENT ILLNESS: [name] complaining of low back pain for about a week.  Denies any neurological symptoms to the legs.  No trouble urinating or moving bowels.  Denies any injuries.  Also complaining of bilateral chronic knee pain.  HPI   Prior to Admission medications   Medication Sig Start Date End Date Taking? Authorizing Provider  Ferrous Sulfate (IRON) 325 (65 Fe) MG TABS Take 1 tablet by mouth daily.    [provider]    No Known Allergies  Patient Active Problem List   Diagnosis Date Noted  . Breast hypertrophy in female 06/24/2017  . Routine general medical examination at a health care facility 12/26/2015  . Thoracic back pain 12/26/2015  . Vitamin D deficiency 12/26/2015  . Iron deficiency anemia 12/26/2015  . Obesity 08/09/2013    Past Medical History:  Diagnosis Date  . History of kidney stones   . Hx of migraine headaches   . Hx of migraines     Past Surgical History:  Procedure Laterality Date  . BREAST REDUCTION SURGERY Bilateral 06/24/2017   Procedure: MAMMARY REDUCTION  (BREAST);  Surgeon: Crissie Reese, MD;  Location: [facility];  Service: Plastics;  Laterality: Bilateral;  . BREAST SURGERY Bilateral 05/2017  . CESAREAN SECTION  03, 05, 06   x's 3  . CHOLECYSTECTOMY  2007  . TUBAL LIGATION  2006    Social History   Socioeconomic History  . Marital status: Single    Spouse name: Not on file  . Number of children: 4  . Years of education: 30  . Highest education level: Not on file  Social Needs  . Financial resource strain: Not on file  . Food insecurity - worry: Not on file  . Food insecurity - inability: Not on file  . Transportation needs - medical: Not on file  . Transportation needs - non-medical: Not on file  Occupational  History  . Occupation: CNA  Tobacco Use  . Smoking status: Current Some Day Smoker    Packs/day: 0.10    Years: 5.00    Pack years: 0.50    Types: Cigarettes  . Smokeless tobacco: Never Used  . Tobacco comment: 1 cig weekly  Substance and Sexual Activity  . Alcohol use: Yes    Comment: occasional  . Drug use: No  . Sexual activity: Yes  Other Topics Concern  . Not on file  Social History Narrative   Fun: Rest   Denies abuse and feels safe at home    Family History  Problem Relation Age of Onset  . Healthy Mother   . Healthy Father   . Healthy Maternal Grandmother   . Healthy Maternal Grandfather   . Healthy Paternal Grandmother   . Healthy Paternal Grandfather      Review of Systems  Constitutional: Negative.  Negative for chills and fever.  HENT: Negative.   Eyes: Negative.   Respiratory: Negative.  Negative for cough and shortness of breath.   Cardiovascular: Negative.  Negative for chest pain and palpitations.  Gastrointestinal: Negative.  Negative for abdominal pain, diarrhea, nausea and vomiting.  Genitourinary: Negative.   Musculoskeletal: Positive for back pain and joint pain (knees).  Skin: Negative.  Negative for rash.  Neurological: Negative.  Negative for dizziness and headaches.  Endo/Heme/Allergies: Negative.   All other systems reviewed and are negative.   Vitals:   09/17/17 1115  BP: 110/62  Pulse: 83  Resp: 16  Temp: 98.3 F (36.8 C)  SpO2: 98%    Physical Exam  Constitutional: She is oriented to person, place, and time. She appears well-developed.  Obese  HENT:  Head: Normocephalic and atraumatic.  Mouth/Throat: Oropharynx is clear and moist.  Eyes: EOM are normal. Pupils are equal, round, and reactive to light.  Neck: Normal range of motion. Neck supple.  Cardiovascular: Normal rate and regular rhythm.  Pulmonary/Chest: Effort normal.  Abdominal: Soft. There is no tenderness.  Musculoskeletal:       Lumbar back: She exhibits  decreased range of motion, tenderness and spasm. She exhibits no bony tenderness and normal pulse.  Knees: Full range of motion.  No significant tenderness.  No swelling.  No bruising.  Stable in flexion and extension.  Neurological: She is alert and oriented to person, place, and time. She displays normal reflexes. No sensory deficit. She exhibits normal muscle tone.  Skin: Skin is warm and dry. Capillary refill takes less than 2 seconds. No rash noted.  Psychiatric: She has a normal mood and affect. Her behavior is normal.  Vitals reviewed.  Dg Lumbar Spine 2-3 Views  Result Date: 09/17/2017 CLINICAL DATA:  Lumbago EXAM: LUMBAR SPINE - 2-3 VIEW COMPARISON:  None. FINDINGS: Frontal, lateral, and spot lumbosacral lateral images were obtained. There are 5 non-rib-bearing lumbar type vertebral bodies. There is no fracture or spondylolisthesis. The disc spaces appear normal. No erosive change. Tubal ligation clips are present bilaterally. IMPRESSION: No fracture or spondylolisthesis. No appreciable arthropathic change. Electronically Signed   By: Lowella Grip III M.D.   On: 09/17/2017 12:02   A total of 30 minutes was spent in the room with the patient, greater than 50% of which was in counseling/coordination of care.   ASSESSMENT & PLAN: Dawn Young was seen today for back pain and knee pain.  Diagnoses and all orders for this visit:  Acute myofascial strain of lumbar region, initial encounter -     DG Lumbar Spine 2-3 Views; Future -     cyclobenzaprine (FLEXERIL) 10 MG tablet; Take 1 tablet (10 mg total) by mouth at bedtime. -     diclofenac (VOLTAREN) 75 MG EC tablet; Take 1 tablet (75 mg total) by mouth 2 (two) times daily for 5 days.  Chronic pain of both knees  Acute bilateral low back pain without sciatica    Patient Instructions       IF you received an x-ray today, you will receive an invoice from [facility]. Please contact [facility] at [phone]  with questions or concerns regarding your invoice.   IF you received labwork today, you will receive an invoice from [facility]. Please contact [facility] at [phone] with questions or concerns regarding your invoice.   Our billing staff will not be able to assist you with questions regarding bills from these companies.  You will be contacted with the lab results as soon as they are available. The fastest way to get your results is to activate your My Chart account. Instructions are located on the last page of this paperwork. If you have not heard from [place] regarding the results in 2 weeks, please contact this office.     Back Pain, Adult Back pain is very common. The pain often gets better over time. The cause of  back pain is usually not dangerous. Most people can learn to manage their back pain on their own. Follow these instructions at home: Watch your back pain for any changes. The following actions may help to lessen any pain you are feeling:  Stay active. Start with short walks on flat ground if you can. Try to walk farther each day.  Exercise regularly as told by your doctor. Exercise helps your back heal faster. It also helps avoid future injury by keeping your muscles strong and flexible.  Do not sit, drive, or stand in one place for more than 30 minutes.  Do not stay in bed. Resting more than 1-2 days can slow down your recovery.  Be careful when you bend or lift an object. Use good form when lifting: ? Bend at your knees. ? Keep the object close to your body. ? Do not twist.  Sleep on a firm mattress. Lie on your side, and bend your knees. If you lie on your back, put a pillow under your knees.  Take medicines only as told by your doctor.  Put ice on the injured area. ? Put ice in a plastic bag. ? Place a towel between your skin and the bag. ? Leave the ice on for 20 minutes, 2-3 times a day for the first 2-3 days. After that, you can switch between ice and heat  packs.  Avoid feeling anxious or stressed. Find good ways to deal with stress, such as exercise.  Maintain a healthy weight. Extra weight puts stress on your back.  Contact a doctor if:  You have pain that does not go away with rest or medicine.  You have worsening pain that goes down into your legs or buttocks.  You have pain that does not get better in one week.  You have pain at night.  You lose weight.  You have a fever or chills. Get help right away if:  You cannot control when you poop (bowel movement) or pee (urinate).  Your arms or legs feel weak.  Your arms or legs lose feeling (numbness).  You feel sick to your stomach (nauseous) or throw up (vomit).  You have belly (abdominal) pain.  You feel like you may pass out (faint). This information is not intended to replace advice given to you by your health care provider. Make sure you discuss any questions you have with your health care provider. Document Released: 12/30/2007 Document Revised: 12/19/2015 Document Reviewed: 11/14/2013 Elsevier Interactive Patient Education  2018 Elsevier Inc.      Agustina Caroli, MD [facility]

## 2017-09-17 NOTE — Telephone Encounter (Signed)
Per conversation with Dr. Mitchel Honour, patient is to take diclofenac 1 twice daily for 5 days and then as needed after that. Phone call to [facility], prescription clarified with pharmacy staff. Closing note.

## 2017-10-21 ENCOUNTER — Other Ambulatory Visit: Payer: Self-pay | Admitting: Family

## 2017-10-21 DIAGNOSIS — Z0000 Encounter for general adult medical examination without abnormal findings: Secondary | ICD-10-CM

## 2017-10-21 DIAGNOSIS — E785 Hyperlipidemia, unspecified: Secondary | ICD-10-CM

## 2017-10-21 DIAGNOSIS — E559 Vitamin D deficiency, unspecified: Secondary | ICD-10-CM

## 2017-10-21 DIAGNOSIS — Z1329 Encounter for screening for other suspected endocrine disorder: Secondary | ICD-10-CM

## 2017-10-25 ENCOUNTER — Encounter: Payer: Self-pay | Admitting: Women's Health

## 2017-10-25 ENCOUNTER — Ambulatory Visit (INDEPENDENT_AMBULATORY_CARE_PROVIDER_SITE_OTHER): Payer: No Typology Code available for payment source | Admitting: Women's Health

## 2017-10-25 VITALS — BP 118/80 | Ht 66.0 in | Wt 280.0 lb

## 2017-10-25 DIAGNOSIS — Z113 Encounter for screening for infections with a predominantly sexual mode of transmission: Secondary | ICD-10-CM | POA: Diagnosis not present

## 2017-10-25 DIAGNOSIS — Z01419 Encounter for gynecological examination (general) (routine) without abnormal findings: Secondary | ICD-10-CM | POA: Diagnosis not present

## 2017-10-25 DIAGNOSIS — N920 Excessive and frequent menstruation with regular cycle: Secondary | ICD-10-CM | POA: Diagnosis not present

## 2017-10-25 NOTE — Patient Instructions (Signed)
Health Maintenance, Female Adopting a healthy lifestyle and getting preventive care can go a long way to promote health and wellness. Talk with your health care provider about what schedule of regular examinations is right for you. This is a good chance for you to check in with your provider about disease prevention and staying healthy. In between checkups, there are plenty of things you can do on your own. Experts have done a lot of research about which lifestyle changes and preventive measures are most likely to keep you healthy. Ask your health care provider for more information. Weight and diet Eat a healthy diet  Be sure to include plenty of vegetables, fruits, low-fat dairy products, and lean protein.  Do not eat a lot of foods high in solid fats, added sugars, or salt.  Get regular exercise. This is one of the most important things you can do for your health. ? Most adults should exercise for at least 150 minutes each week. The exercise should increase your heart rate and make you sweat (moderate-intensity exercise). ? Most adults should also do strengthening exercises at least twice a week. This is in addition to the moderate-intensity exercise.  Maintain a healthy weight  Body mass index (BMI) is a measurement that can be used to identify possible weight problems. It estimates body fat based on height and weight. Your health care provider can help determine your BMI and help you achieve or maintain a healthy weight.  For females 20 years of age and older: ? A BMI below 18.5 is considered underweight. ? A BMI of 18.5 to 24.9 is normal. ? A BMI of 25 to 29.9 is considered overweight. ? A BMI of 30 and above is considered obese.  Watch levels of cholesterol and blood lipids  You should start having your blood tested for lipids and cholesterol at 35 years of age, then have this test every 5 years.  You may need to have your cholesterol levels checked more often if: ? Your lipid or  cholesterol levels are high. ? You are older than 35 years of age. ? You are at high risk for heart disease.  Cancer screening Lung Cancer  Lung cancer screening is recommended for adults 55-80 years old who are at high risk for lung cancer because of a history of smoking.  A yearly low-dose CT scan of the lungs is recommended for people who: ? Currently smoke. ? Have quit within the past 15 years. ? Have at least a 30-pack-year history of smoking. A pack year is smoking an average of one pack of cigarettes a day for 1 year.  Yearly screening should continue until it has been 15 years since you quit.  Yearly screening should stop if you develop a health problem that would prevent you from having lung cancer treatment.  Breast Cancer  Practice breast self-awareness. This means understanding how your breasts normally appear and feel.  It also means doing regular breast self-exams. Let your health care provider know about any changes, no matter how small.  If you are in your 20s or 30s, you should have a clinical breast exam (CBE) by a health care provider every 1-3 years as part of a regular health exam.  If you are 40 or older, have a CBE every year. Also consider having a breast X-ray (mammogram) every year.  If you have a family history of breast cancer, talk to your health care provider about genetic screening.  If you are at high risk   for breast cancer, talk to your health care provider about having an MRI and a mammogram every year.  Breast cancer gene (BRCA) assessment is recommended for women who have family members with BRCA-related cancers. BRCA-related cancers include: ? Breast. ? Ovarian. ? Tubal. ? Peritoneal cancers.  Results of the assessment will determine the need for genetic counseling and BRCA1 and BRCA2 testing.  Cervical Cancer Your health care provider may recommend that you be screened regularly for cancer of the pelvic organs (ovaries, uterus, and  vagina). This screening involves a pelvic examination, including checking for microscopic changes to the surface of your cervix (Pap test). You may be encouraged to have this screening done every 3 years, beginning at age 22.  For women ages 56-65, health care providers may recommend pelvic exams and Pap testing every 3 years, or they may recommend the Pap and pelvic exam, combined with testing for human papilloma virus (HPV), every 5 years. Some types of HPV increase your risk of cervical cancer. Testing for HPV may also be done on women of any age with unclear Pap test results.  Other health care providers may not recommend any screening for nonpregnant women who are considered low risk for pelvic cancer and who do not have symptoms. Ask your health care provider if a screening pelvic exam is right for you.  If you have had past treatment for cervical cancer or a condition that could lead to cancer, you need Pap tests and screening for cancer for at least 20 years after your treatment. If Pap tests have been discontinued, your risk factors (such as having a new sexual partner) need to be reassessed to determine if screening should resume. Some women have medical problems that increase the chance of getting cervical cancer. In these cases, your health care provider may recommend more frequent screening and Pap tests.  Colorectal Cancer  This type of cancer can be detected and often prevented.  Routine colorectal cancer screening usually begins at 35 years of age and continues through 35 years of age.  Your health care provider may recommend screening at an earlier age if you have risk factors for colon cancer.  Your health care provider may also recommend using home test kits to check for hidden blood in the stool.  A small camera at the end of a tube can be used to examine your colon directly (sigmoidoscopy or colonoscopy). This is done to check for the earliest forms of colorectal  cancer.  Routine screening usually begins at age 33.  Direct examination of the colon should be repeated every 5-10 years through 35 years of age. However, you may need to be screened more often if early forms of precancerous polyps or small growths are found.  Skin Cancer  Check your skin from head to toe regularly.  Tell your health care provider about any new moles or changes in moles, especially if there is a change in a mole's shape or color.  Also tell your health care provider if you have a mole that is larger than the size of a pencil eraser.  Always use sunscreen. Apply sunscreen liberally and repeatedly throughout the day.  Protect yourself by wearing long sleeves, pants, a wide-brimmed hat, and sunglasses whenever you are outside.  Heart disease, diabetes, and high blood pressure  High blood pressure causes heart disease and increases the risk of stroke. High blood pressure is more likely to develop in: ? People who have blood pressure in the high end of  the normal range (130-139/85-89 mm Hg). ? People who are overweight or obese. ? People who are African American.  If you are 21-29 years of age, have your blood pressure checked every 3-5 years. If you are 3 years of age or older, have your blood pressure checked every year. You should have your blood pressure measured twice-once when you are at a hospital or clinic, and once when you are not at a hospital or clinic. Record the average of the two measurements. To check your blood pressure when you are not at a hospital or clinic, you can use: ? An automated blood pressure machine at a pharmacy. ? A home blood pressure monitor.  If you are between 17 years and 37 years old, ask your health care provider if you should take aspirin to prevent strokes.  Have regular diabetes screenings. This involves taking a blood sample to check your fasting blood sugar level. ? If you are at a normal weight and have a low risk for diabetes,  have this test once every three years after 35 years of age. ? If you are overweight and have a high risk for diabetes, consider being tested at a younger age or more often. Preventing infection Hepatitis B  If you have a higher risk for hepatitis B, you should be screened for this virus. You are considered at high risk for hepatitis B if: ? You were born in a country where hepatitis B is common. Ask your health care provider which countries are considered high risk. ? Your parents were born in a high-risk country, and you have not been immunized against hepatitis B (hepatitis B vaccine). ? You have HIV or AIDS. ? You use needles to inject street drugs. ? You live with someone who has hepatitis B. ? You have had sex with someone who has hepatitis B. ? You get hemodialysis treatment. ? You take certain medicines for conditions, including cancer, organ transplantation, and autoimmune conditions.  Hepatitis C  Blood testing is recommended for: ? Everyone born from [birth year] through 1963. ? Anyone with known risk factors for hepatitis C.  Sexually transmitted infections (STIs)  You should be screened for sexually transmitted infections (STIs) including gonorrhea and chlamydia if: ? You are sexually active and are younger than 35 years of age. ? You are older than 36 years of age and your health care provider tells you that you are at risk for this type of infection. ? Your sexual activity has changed since you were last screened and you are at an increased risk for chlamydia or gonorrhea. Ask your health care provider if you are at risk.  If you do not have HIV, but are at risk, it may be recommended that you take a prescription medicine daily to prevent HIV infection. This is called pre-exposure prophylaxis (PrEP). You are considered at risk if: ? You are sexually active and do not regularly use condoms or know the HIV status of your partner(s). ? You take drugs by injection. ? You are  sexually active with a partner who has HIV.  Talk with your health care provider about whether you are at high risk of being infected with HIV. If you choose to begin PrEP, you should first be tested for HIV. You should then be tested every 3 months for as long as you are taking PrEP. Pregnancy  If you are premenopausal and you may become pregnant, ask your health care provider about preconception counseling.  If you may become  pregnant, take 400 to 800 micrograms (mcg) of folic acid every day.  If you want to prevent pregnancy, talk to your health care provider about birth control (contraception). Osteoporosis and menopause  Osteoporosis is a disease in which the bones lose minerals and strength with aging. This can result in serious bone fractures. Your risk for osteoporosis can be identified using a bone density scan.  If you are 65 years of age or older, or if you are at risk for osteoporosis and fractures, ask your health care provider if you should be screened.  Ask your health care provider whether you should take a calcium or vitamin D supplement to lower your risk for osteoporosis.  Menopause may have certain physical symptoms and risks.  Hormone replacement therapy may reduce some of these symptoms and risks. Talk to your health care provider about whether hormone replacement therapy is right for you. Follow these instructions at home:  Schedule regular health, dental, and eye exams.  Stay current with your immunizations.  Do not use any tobacco products including cigarettes, chewing tobacco, or electronic cigarettes.  If you are pregnant, do not drink alcohol.  If you are breastfeeding, limit how much and how often you drink alcohol.  Limit alcohol intake to no more than 1 drink per day for nonpregnant women. One drink equals 12 ounces of beer, 5 ounces of wine, or 1 ounces of hard liquor.  Do not use street drugs.  Do not share needles.  Ask your health care  provider for help if you need support or information about quitting drugs.  Tell your health care provider if you often feel depressed.  Tell your health care provider if you have ever been abused or do not feel safe at home. This information is not intended to replace advice given to you by your health care provider. Make sure you discuss any questions you have with your health care provider. Document Released: 01/26/2011 Document Revised: 12/19/2015 Document Reviewed: 04/16/2015 Elsevier Interactive Patient Education  2018 [employer] Carbohydrate Counting for Diabetes Mellitus, Adult Carbohydrate counting is a method for keeping track of how many carbohydrates you eat. Eating carbohydrates naturally increases the amount of sugar (glucose) in the blood. Counting how many carbohydrates you eat helps keep your blood glucose within normal limits, which helps you manage your diabetes (diabetes mellitus). It is important to know how many carbohydrates you can safely have in each meal. This is different for every person. A diet and nutrition specialist (registered dietitian) can help you make a meal plan and calculate how many carbohydrates you should have at each meal and snack. Carbohydrates are found in the following foods:  Grains, such as breads and cereals.  Dried beans and soy products.  Starchy vegetables, such as potatoes, peas, and corn.  Fruit and fruit juices.  Milk and yogurt.  Sweets and snack foods, such as cake, cookies, candy, chips, and soft drinks.  How do I count carbohydrates? There are two ways to count carbohydrates in food. You can use either of the methods or a combination of both. Reading "Nutrition Facts" on packaged food The "Nutrition Facts" list is included on the labels of almost all packaged foods and beverages in the [place] It includes:  The serving size.  Information about nutrients in each serving, including the grams (g) of carbohydrate per  serving.  To use the "Nutrition Facts":  Decide how many servings you will have.  Multiply the number of servings by the number of carbohydrates   per serving.  The resulting number is the total amount of carbohydrates that you will be having.  Learning standard serving sizes of other foods When you eat foods containing carbohydrates that are not packaged or do not include "Nutrition Facts" on the label, you need to measure the servings in order to count the amount of carbohydrates:  Measure the foods that you will eat with a food scale or measuring cup, if needed.  Decide how many standard-size servings you will eat.  Multiply the number of servings by 15. Most carbohydrate-rich foods have about 15 g of carbohydrates per serving. ? For example, if you eat 8 oz (170 g) of strawberries, you will have eaten 2 servings and 30 g of carbohydrates (2 servings x 15 g = 30 g).  For foods that have more than one food mixed, such as soups and casseroles, you must count the carbohydrates in each food that is included.  The following list contains standard serving sizes of common carbohydrate-rich foods. Each of these servings has about 15 g of carbohydrates:   hamburger bun or  English muffin.   oz (15 mL) syrup.   oz (14 g) jelly.  1 slice of bread.  1 six-inch tortilla.  3 oz (85 g) cooked rice or pasta.  4 oz (113 g) cooked dried beans.  4 oz (113 g) starchy vegetable, such as peas, corn, or potatoes.  4 oz (113 g) hot cereal.  4 oz (113 g) mashed potatoes or  of a large baked potato.  4 oz (113 g) canned or frozen fruit.  4 oz (120 mL) fruit juice.  4-6 crackers.  6 chicken nuggets.  6 oz (170 g) unsweetened dry cereal.  6 oz (170 g) plain fat-free yogurt or yogurt sweetened with artificial sweeteners.  8 oz (240 mL) milk.  8 oz (170 g) fresh fruit or one small piece of fruit.  24 oz (680 g) popped popcorn.  Example of carbohydrate counting Sample meal  3  oz (85 g) chicken breast.  6 oz (170 g) brown rice.  4 oz (113 g) corn.  8 oz (240 mL) milk.  8 oz (170 g) strawberries with sugar-free whipped topping. Carbohydrate calculation 1. Identify the foods that contain carbohydrates: ? Rice. ? Corn. ? Milk. ? Strawberries. 2. Calculate how many servings you have of each food: ? 2 servings rice. ? 1 serving corn. ? 1 serving milk. ? 1 serving strawberries. 3. Multiply each number of servings by 15 g: ? 2 servings rice x 15 g = 30 g. ? 1 serving corn x 15 g = 15 g. ? 1 serving milk x 15 g = 15 g. ? 1 serving strawberries x 15 g = 15 g. 4. Add together all of the amounts to find the total grams of carbohydrates eaten: ? 30 g + 15 g + 15 g + 15 g = 75 g of carbohydrates total. This information is not intended to replace advice given to you by your health care provider. Make sure you discuss any questions you have with your health care provider. Document Released: 07/13/2005 Document Revised: 01/31/2016 Document Reviewed: 12/25/2015 Elsevier Interactive Patient Education  2018 [employer]. Endometrial Ablation Endometrial ablation is a procedure that destroys the thin inner layer of the lining of the uterus (endometrium). This procedure may be done:  To stop heavy periods.  To stop bleeding that is causing anemia.  To control irregular bleeding.  To treat bleeding caused by small tumors (fibroids)  in the endometrium.  This procedure is often an alternative to major surgery, such as removal of the uterus and cervix (hysterectomy). As a result of this procedure:  You may not be able to have children. However, if you are premenopausal (you have not gone through menopause): ? You may still have a small chance of getting pregnant. ? You will need to use a reliable method of birth control after the procedure to prevent pregnancy.  You may stop having a menstrual period, or you may have only a small amount of bleeding during your  period. Menstruation may return several years after the procedure.  Tell a health care provider about:  Any allergies you have.  All medicines you are taking, including vitamins, herbs, eye drops, creams, and over-the-counter medicines.  Any problems you or family members have had with the use of anesthetic medicines.  Any blood disorders you have.  Any surgeries you have had.  Any medical conditions you have. What are the risks? Generally, this is a safe procedure. However, problems may occur, including:  A hole (perforation) in the uterus or bowel.  Infection of the uterus, bladder, or vagina.  Bleeding.  Damage to other structures or organs.  An air bubble in the lung (air embolus).  Problems with pregnancy after the procedure.  Failure of the procedure.  Decreased ability to diagnose cancer in the endometrium.  What happens before the procedure?  You will have tests of your endometrium to make sure there are no pre-cancerous cells or cancer cells present.  You may have an ultrasound of the uterus.  You may be given medicines to thin the endometrium.  Ask your health care provider about: ? Changing or stopping your regular medicines. This is especially important if you take diabetes medicines or blood thinners. ? Taking medicines such as aspirin and ibuprofen. These medicines can thin your blood. Do not take these medicines before your procedure if your doctor tells you not to.  Plan to have someone take you home from the hospital or clinic. What happens during the procedure?  You will lie on an exam table with your feet and legs supported as in a pelvic exam.  To lower your risk of infection: ? Your health care team will wash or sanitize their hands and put on germ-free (sterile) gloves. ? Your genital area will be washed with soap.  An IV tube will be inserted into one of your veins.  You will be given a medicine to help you relax (sedative).  A  surgical instrument with a light and camera (resectoscope) will be inserted into your vagina and moved into your uterus. This allows your surgeon to see inside your uterus.  Endometrial tissue will be removed using one of the following methods: ? Radiofrequency. This method uses a radiofrequency-alternating electric current to remove the endometrium. ? Cryotherapy. This method uses extreme cold to freeze the endometrium. ? Heated-free liquid. This method uses a heated saltwater (saline) solution to remove the endometrium. ? Microwave. This method uses high-energy microwaves to heat up the endometrium and remove it. ? Thermal balloon. This method involves inserting a catheter with a balloon tip into the uterus. The balloon tip is filled with heated fluid to remove the endometrium. The procedure may vary among health care providers and hospitals. What happens after the procedure?  Your blood pressure, heart rate, breathing rate, and blood oxygen level will be monitored until the medicines you were given have worn off.  As tissue healing  occurs, you may notice vaginal bleeding for 4-6 weeks after the procedure. You may also experience: ? Cramps. ? Thin, watery vaginal discharge that is light pink or brown in color. ? A need to urinate more frequently than usual. ? Nausea.  Do not drive for 24 hours if you were given a sedative.  Do not have sex or insert anything into your vagina until your health care provider approves. Summary  Endometrial ablation is done to treat the many causes of heavy menstrual bleeding.  The procedure may be done only after medications have been tried to control the bleeding.  Plan to have someone take you home from the hospital or clinic. This information is not intended to replace advice given to you by your health care provider. Make sure you discuss any questions you have with your health care provider. Document Released: 05/22/2004 Document Revised: 07/30/2016  Document Reviewed: 07/30/2016 Elsevier Interactive Patient Education  2017 [employer].

## 2017-10-25 NOTE — Progress Notes (Signed)
FELISIA BALCOM 1981/07/17 [id number]    History:    Presents for new established patient annual exam with complaint of heavy regular monthly 7-8 day cycles, first 3-4 days heavy changing protection every 1-2 hours.  Cycles have become heavier in the last 6-8 months.  History of an abnormal Pap greater than 10 years ago with no needed treatment.  Last Pap 2015 normal.  Same partner for 5 years.  Breast reduction 2018.  HSV 1 rare outbreaks.  History of anemia.  Past medical history, past surgical history, family history and social history were all reviewed and documented in the EPIC chart.  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  4 children ages 59-16, all doing well.  Parents healthy.  ROS:  A ROS was performed and pertinent positives and negatives are included.  Exam:  Vitals:   10/25/17 1427  BP: 118/80  Weight: 280 lb (127 kg)  Height: 5\' 6"  (1.676 m)   Body mass index is 45.19 kg/m.   General appearance:  Normal Thyroid:  Symmetrical, normal in size, without palpable masses or nodularity. Respiratory  Auscultation:  Clear without wheezing or rhonchi Cardiovascular  Auscultation:  Regular rate, without rubs, murmurs or gallops  Edema/varicosities:  Not grossly evident Abdominal  Soft,nontender, without masses, guarding or rebound.  Liver/spleen:  No organomegaly noted  Hernia:  None appreciated  Skin  Inspection:  Grossly normal   Breasts: Examined lying and sitting.     Right: Without masses, retractions, discharge or axillary adenopathy.     Left: Without masses, retractions, discharge or axillary adenopathy. Gentitourinary   Inguinal/mons:  Normal without inguinal adenopathy  External genitalia:  Normal  BUS/Urethra/Skene's glands:  Normal  Vagina:  Normal  Cervix:  Normal  Uterus:  normal in size, shape and contour.  Midline and mobile  Adnexa/parametria:     Rt: Without masses or tenderness.   Lt: Without masses or tenderness.  Anus and perineum: Normal  Digital rectal  exam: Normal sphincter tone without palpated masses or tenderness  Assessment/Plan:  37 y.o. SPF G4P4 for annual exam with complaint of menorrhagia.  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 Allyson Sabal for removal will get scheduled.  Options for menorrhagia reviewed will schedule sonohysterogram with Dr. Dellis Filbert, endometrial ablation discussed. SBEs, annual screening mammogram at 40, calcium rich foods, vitamin D 1000 daily encouraged.  Reviewed importance of increasing activity and decreasing calories/carbs for weight loss.  CBC, TSH, CMP, Pap with HR HPV typing, GC/chlamydia, HIV, hep B, C, RPR.  Will have fasting lipid panel done at annual exam with primary care in 2 weeks.    [place], 2:58 PM 10/25/2017

## 2017-10-26 LAB — CBC WITH DIFFERENTIAL/PLATELET
Basophils Absolute: 38 cells/uL (ref 0–200)
Basophils Relative: 0.5 %
Eosinophils Absolute: 68 cells/uL (ref 15–500)
Eosinophils Relative: 0.9 %
HCT: 30 % — ABNORMAL LOW (ref 35.0–45.0)
Hemoglobin: 9.9 g/dL — ABNORMAL LOW (ref 11.7–15.5)
Lymphs Abs: 2640 cells/uL (ref 850–3900)
MCH: 23.8 pg — ABNORMAL LOW (ref 27.0–33.0)
MCHC: 33 g/dL (ref 32.0–36.0)
MCV: 72.1 fL — ABNORMAL LOW (ref 80.0–100.0)
MPV: 11 fL (ref 7.5–12.5)
Monocytes Relative: 6.9 %
Neutro Abs: 4238 cells/uL (ref 1500–7800)
Neutrophils Relative %: 56.5 %
Platelets: 309 10*3/uL (ref 140–400)
RBC: 4.16 10*6/uL (ref 3.80–5.10)
RDW: 16.4 % — ABNORMAL HIGH (ref 11.0–15.0)
Total Lymphocyte: 35.2 %
WBC mixed population: 518 cells/uL (ref 200–950)
WBC: 7.5 10*3/uL (ref 3.8–10.8)

## 2017-10-26 LAB — COMPREHENSIVE METABOLIC PANEL
AG Ratio: 1.2 (calc) (ref 1.0–2.5)
ALT: 14 U/L (ref 6–29)
AST: 12 U/L (ref 10–30)
Albumin: 4.1 g/dL (ref 3.6–5.1)
Alkaline phosphatase (APISO): 69 U/L (ref 33–115)
BUN: 13 mg/dL (ref 7–25)
CO2: 28 mmol/L (ref 20–32)
Calcium: 9.8 mg/dL (ref 8.6–10.2)
Chloride: 105 mmol/L (ref 98–110)
Creat: 0.78 mg/dL (ref 0.50–1.10)
Globulin: 3.3 g/dL (calc) (ref 1.9–3.7)
Glucose, Bld: 89 mg/dL (ref 65–99)
Potassium: 4.2 mmol/L (ref 3.5–5.3)
Sodium: 140 mmol/L (ref 135–146)
Total Bilirubin: 0.2 mg/dL (ref 0.2–1.2)
Total Protein: 7.4 g/dL (ref 6.1–8.1)

## 2017-10-26 LAB — PAP, TP IMAGING W/ HPV RNA, RFLX HPV TYPE 16,18/45: HPV DNA High Risk: NOT DETECTED

## 2017-10-26 LAB — C. TRACHOMATIS/N. GONORRHOEAE RNA
C. trachomatis RNA, TMA: NOT DETECTED
N. gonorrhoeae RNA, TMA: NOT DETECTED

## 2017-10-26 LAB — RPR: RPR Ser Ql: REACTIVE — AB

## 2017-10-26 LAB — FLUORESCENT TREPONEMAL AB(FTA)-IGG-BLD: Fluorescent Treponemal ABS: NONREACTIVE

## 2017-10-26 LAB — TSH: TSH: 1.1 mIU/L

## 2017-10-26 LAB — RPR TITER: RPR Titer: 1:1 {titer} — ABNORMAL HIGH

## 2017-10-26 LAB — HEPATITIS B SURFACE ANTIGEN: Hepatitis B Surface Ag: NONREACTIVE

## 2017-10-26 LAB — HEPATITIS C ANTIBODY
Hepatitis C Ab: NONREACTIVE
SIGNAL TO CUT-OFF: 0.02 (ref <1.00)

## 2017-10-26 LAB — HIV ANTIBODY (ROUTINE TESTING W REFLEX): HIV 1&2 Ab, 4th Generation: NONREACTIVE

## 2017-10-27 ENCOUNTER — Other Ambulatory Visit: Payer: Self-pay | Admitting: Women's Health

## 2017-10-27 ENCOUNTER — Telehealth: Payer: Self-pay | Admitting: *Deleted

## 2017-10-27 NOTE — Telephone Encounter (Signed)
-----   Message from Huel Cote, NP sent at 10/25/2017  3:02 PM EDT ----- Needs a referral to have multiple skin tags removed from both facial cheeks.  Requested Dr. Allyson Sabal.

## 2017-10-27 NOTE — Telephone Encounter (Signed)
I called [facility] office and Dr.Lupton has retired, pt is fine with seeing any doctor at this location, office notes faxed to [phone] they will call pt schedule and fax me back with time and date. Pt aware of this as well.

## 2017-11-02 ENCOUNTER — Other Ambulatory Visit: Payer: Self-pay | Admitting: Obstetrics & Gynecology

## 2017-11-02 DIAGNOSIS — N939 Abnormal uterine and vaginal bleeding, unspecified: Secondary | ICD-10-CM

## 2017-11-04 ENCOUNTER — Other Ambulatory Visit: Payer: No Typology Code available for payment source | Admitting: Internal Medicine

## 2017-11-04 DIAGNOSIS — Z1329 Encounter for screening for other suspected endocrine disorder: Secondary | ICD-10-CM

## 2017-11-04 DIAGNOSIS — Z0000 Encounter for general adult medical examination without abnormal findings: Secondary | ICD-10-CM

## 2017-11-04 DIAGNOSIS — E785 Hyperlipidemia, unspecified: Secondary | ICD-10-CM

## 2017-11-04 DIAGNOSIS — E559 Vitamin D deficiency, unspecified: Secondary | ICD-10-CM

## 2017-11-05 LAB — CBC WITH DIFFERENTIAL/PLATELET
Basophils Absolute: 18 cells/uL (ref 0–200)
Basophils Relative: 0.3 %
Eosinophils Absolute: 67 cells/uL (ref 15–500)
Eosinophils Relative: 1.1 %
HCT: 29.7 % — ABNORMAL LOW (ref 35.0–45.0)
Hemoglobin: 9.9 g/dL — ABNORMAL LOW (ref 11.7–15.5)
Lymphs Abs: 1519 cells/uL (ref 850–3900)
MCH: 24.1 pg — ABNORMAL LOW (ref 27.0–33.0)
MCHC: 33.3 g/dL (ref 32.0–36.0)
MCV: 72.3 fL — ABNORMAL LOW (ref 80.0–100.0)
MPV: 11 fL (ref 7.5–12.5)
Monocytes Relative: 9.6 %
Neutro Abs: 3910 cells/uL (ref 1500–7800)
Neutrophils Relative %: 64.1 %
Platelets: 270 10*3/uL (ref 140–400)
RBC: 4.11 10*6/uL (ref 3.80–5.10)
RDW: 16.5 % — ABNORMAL HIGH (ref 11.0–15.0)
Total Lymphocyte: 24.9 %
WBC mixed population: 586 cells/uL (ref 200–950)
WBC: 6.1 10*3/uL (ref 3.8–10.8)

## 2017-11-05 LAB — COMPLETE METABOLIC PANEL WITH GFR
AG Ratio: 1.3 (calc) (ref 1.0–2.5)
ALT: 16 U/L (ref 6–29)
AST: 16 U/L (ref 10–30)
Albumin: 4 g/dL (ref 3.6–5.1)
Alkaline phosphatase (APISO): 73 U/L (ref 33–115)
BUN: 13 mg/dL (ref 7–25)
CO2: 26 mmol/L (ref 20–32)
Calcium: 9.4 mg/dL (ref 8.6–10.2)
Chloride: 107 mmol/L (ref 98–110)
Creat: 0.71 mg/dL (ref 0.50–1.10)
GFR, Est African American: 128 mL/min/{1.73_m2} (ref >=60)
GFR, Est Non African American: 110 mL/min/{1.73_m2} (ref >=60)
Globulin: 3 g/dL (calc) (ref 1.9–3.7)
Glucose, Bld: 93 mg/dL (ref 65–99)
Potassium: 4.3 mmol/L (ref 3.5–5.3)
Sodium: 138 mmol/L (ref 135–146)
Total Bilirubin: 0.2 mg/dL (ref 0.2–1.2)
Total Protein: 7 g/dL (ref 6.1–8.1)

## 2017-11-05 LAB — LIPID PANEL
Cholesterol: 141 mg/dL (ref <200)
HDL: 37 mg/dL — ABNORMAL LOW (ref >50)
LDL Cholesterol (Calc): 88 mg/dL (calc)
Non-HDL Cholesterol (Calc): 104 mg/dL (calc) (ref <130)
Total CHOL/HDL Ratio: 3.8 (calc) (ref <5.0)
Triglycerides: 70 mg/dL (ref <150)

## 2017-11-05 LAB — VITAMIN D 25 HYDROXY (VIT D DEFICIENCY, FRACTURES): Vit D, 25-Hydroxy: 12 ng/mL — ABNORMAL LOW (ref 30–100)

## 2017-11-05 LAB — TSH: TSH: 1.28 mIU/L

## 2017-11-08 ENCOUNTER — Encounter: Payer: Self-pay | Admitting: Internal Medicine

## 2017-11-08 ENCOUNTER — Ambulatory Visit (INDEPENDENT_AMBULATORY_CARE_PROVIDER_SITE_OTHER): Payer: No Typology Code available for payment source | Admitting: Internal Medicine

## 2017-11-08 VITALS — BP 150/90 | HR 69 | Ht 65.5 in | Wt 280.0 lb

## 2017-11-08 DIAGNOSIS — R718 Other abnormality of red blood cells: Secondary | ICD-10-CM | POA: Diagnosis not present

## 2017-11-08 DIAGNOSIS — M545 Low back pain, unspecified: Secondary | ICD-10-CM

## 2017-11-08 DIAGNOSIS — Z9889 Other specified postprocedural states: Secondary | ICD-10-CM

## 2017-11-08 DIAGNOSIS — Z0000 Encounter for general adult medical examination without abnormal findings: Secondary | ICD-10-CM | POA: Diagnosis not present

## 2017-11-08 DIAGNOSIS — Z6841 Body Mass Index (BMI) 40.0 and over, adult: Secondary | ICD-10-CM

## 2017-11-08 DIAGNOSIS — E559 Vitamin D deficiency, unspecified: Secondary | ICD-10-CM

## 2017-11-08 DIAGNOSIS — M25561 Pain in right knee: Secondary | ICD-10-CM

## 2017-11-08 DIAGNOSIS — D500 Iron deficiency anemia secondary to blood loss (chronic): Secondary | ICD-10-CM

## 2017-11-08 DIAGNOSIS — N920 Excessive and frequent menstruation with regular cycle: Secondary | ICD-10-CM

## 2017-11-08 MED ORDER — CYCLOBENZAPRINE HCL 10 MG PO TABS: 10.0000 mg | ORAL_TABLET | Freq: Every day | ORAL | 1 refills | Status: DC

## 2017-11-08 MED ORDER — ERGOCALCIFEROL 1.25 MG (50000 UT) PO CAPS: 50000.0000 [IU] | ORAL_CAPSULE | ORAL | 0 refills | Status: DC

## 2017-11-08 MED ORDER — MELOXICAM 15 MG PO TABS: 15.0000 mg | ORAL_TABLET | Freq: Every day | ORAL | 0 refills | Status: DC

## 2017-11-08 MED FILL — VIT D2 1.25 MG (50,000 UNIT: 1.25 MG | 84 days supply | Qty: 12 | Fill #0

## 2017-11-08 MED FILL — MELOXICAM 15 MG TABLET: 15 | 90 days supply | Qty: 90 | Fill #0

## 2017-11-08 MED FILL — CYCLOBENZAPRINE 10 MG TAB: 10 | 90 days supply | Qty: 90 | Fill #0

## 2017-11-08 NOTE — Progress Notes (Signed)
Subjective:    Patient ID: Dawn Young, female    DOB: 05-09-1981, 37 y.o.   MRN: [id number]  HPI 37 year old Black Female presents to the office for the first time today.  She has issues with back pain and right knee pain.  No known drug allergies  History of 4 pregnancies and no miscarriages.  Tubal ligation in 2006  Had cesarean sections in 2003 2005 and 2006.  Cholecystectomy 2007  Breast reduction surgery 2018 by Dr. Harlow Mares  Social history: She is a Chartered certified accountant at [employer].  She completed high school.  She is divorced.  Does not smoke or consume alcohol.  Family history: Father age 74 with history of gout.  Mother living age 36.  She has 2 sons and 2 daughters.  Children are ages 83, 89, 58 and 61 and all are healthy.  Patient has no brothers and no sisters.  In February 2019 she saw Dr. Mitchel Honour for lumbar back pain and was diagnosed with acute myofascial strain of the lumbar region.  Had back x-rays ordered and was treated with Voltaren and Flexeril.   Review of Systems has had right knee pain for 4 years.  Is interested in weight management.  Issues with menorrhagia and will be seeing GYN in the near future. History of iron deficiency anemia in June 2017 as well as vitamin D deficiency at the same time.  History of migraine headaches.  History of kidney stones.    Objective:   Physical Exam  Constitutional: She is oriented to person, place, and time. She appears well-developed and well-nourished. No distress.  HENT:  Head: Normocephalic and atraumatic.  Right Ear: External ear normal.  Left Ear: External ear normal.  Mouth/Throat: Oropharynx is clear and moist.  Eyes: Pupils are equal, round, and reactive to light. Conjunctivae and EOM are normal. Right eye exhibits no discharge. Left eye exhibits no discharge.  Neck: Neck supple. No JVD present. No tracheal deviation present. No thyromegaly present.  Cardiovascular: Normal rate, regular rhythm, normal heart  sounds and intact distal pulses.  No murmur heard. Pulmonary/Chest: No respiratory distress. She has no wheezes. She has no rales.  Bilateral breast reduction surgery well-healed  Abdominal: Soft. Bowel sounds are normal. She exhibits no distension and no mass. There is no tenderness. There is no rebound and no guarding.  Genitourinary:  Genitourinary Comments: Deferred to GYN  Musculoskeletal: She exhibits no edema.  Neurological: She is alert and oriented to person, place, and time. She has normal reflexes. No cranial nerve deficit. Coordination normal.  Skin: Skin is warm and dry. No rash noted. She is not diaphoretic.  Some loss of pigment in skin around the nipples bilaterally  Psychiatric: She has a normal mood and affect. Her behavior is normal. Judgment and thought content normal.  Vitals reviewed.         Assessment & Plan:  Iron deficiency anemia likely secondary to menorrhagia.  Currently is on Loestrin.  Her ferritin is low normal at 14 and her ferritin is low at 28.  She should take iron sulfate 325 mg at least twice daily and follow-up in 3 months.  She is to see Dr. Dellis Filbert in the near future regarding menorrhagia.  Vitamin D deficiency-level is 12 will be treated with 50,000 units vitamin D3 weekly for 12 weeks which should be followed by 2000 units vitamin D3 daily  Low HDL cholesterol at 37  Morbid obesity-recommend Dr. Leafy Ro for management of obesity.  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  May need referral to physical therapy.  Flexeril 10 mg to take 1/2 tablet at bedtime  She will follow-up here in mid July regarding anemia and other issues  Status post breast reduction surgery

## 2017-11-08 NOTE — Patient Instructions (Addendum)
Mobic 15 mg daily. Flexeril 10 mg one half tab at bedtime. Drisdol 50,000 units weekly. Iron studies pending. Referral to Ortho regarding knee pain.  Referral to Dr. Leafy Ro regarding weight management.  Take iron sulfate twice daily and follow-up in 3 months.

## 2017-11-09 ENCOUNTER — Encounter: Payer: Self-pay | Admitting: *Deleted

## 2017-11-09 LAB — IRON,TIBC AND FERRITIN PANEL
%SAT: 9 % (calc) — ABNORMAL LOW (ref 11–50)
Ferritin: 14 ng/mL (ref 10–154)
Iron: 28 ug/dL — ABNORMAL LOW (ref 40–190)
TIBC: 312 mcg/dL (calc) (ref 250–450)

## 2017-11-09 NOTE — Telephone Encounter (Signed)
I called [facility] office and they have called pt and left message on 10/27/17 same day referral was faxed. I tried to call pt and relay and her voicemail was full, so I sent my chart message.

## 2017-11-16 ENCOUNTER — Encounter: Payer: Self-pay | Admitting: Obstetrics & Gynecology

## 2017-11-16 ENCOUNTER — Ambulatory Visit (INDEPENDENT_AMBULATORY_CARE_PROVIDER_SITE_OTHER): Payer: No Typology Code available for payment source

## 2017-11-16 ENCOUNTER — Ambulatory Visit (INDEPENDENT_AMBULATORY_CARE_PROVIDER_SITE_OTHER): Payer: No Typology Code available for payment source | Admitting: Obstetrics & Gynecology

## 2017-11-16 DIAGNOSIS — N920 Excessive and frequent menstruation with regular cycle: Secondary | ICD-10-CM | POA: Diagnosis not present

## 2017-11-16 DIAGNOSIS — D649 Anemia, unspecified: Secondary | ICD-10-CM

## 2017-11-16 DIAGNOSIS — N939 Abnormal uterine and vaginal bleeding, unspecified: Secondary | ICD-10-CM | POA: Diagnosis not present

## 2017-11-16 MED ORDER — NORETHIN ACE-ETH ESTRAD-FE 1-20 MG-MCG(24) PO TABS: 1.0000 | ORAL_TABLET | Freq: Every day | ORAL | 4 refills | Status: DC

## 2017-11-16 NOTE — Patient Instructions (Signed)
1. Menorrhagia with regular cycle Menorrhagia with secondary anemia.  Hemoglobin 9.9 in April 2019.  Pelvic ultrasound within normal with a normal endometrial cavity per sonohysterogram.  Endometrial biopsy done none the less.  Findings discussed with patient.  Patient reassured.  Counseling on management of heavy menses done with patient.  No contraindication to birth control pill as patient is not a smoker, no history of high blood pressure and no diagnosis of diabetes or hypercholesterolemia.  Only risk factor for cardiovascular disease is her obesity.  Progesterone only pill, Progesterone IUD, Nexplanon, Depo-Provera injections and endometrial ablation discussed with patient.  Patient opts for the birth control pill with low estrogen and progestin.  Lomedia 24 Fe 1/20 prescribed.  Usage, risks and benefits reviewed with patient.  2. Secondary anemia Recommend iron rich foods and iron sulfate supplement daily.  Other orders - Norethindrone Acetate-Ethinyl Estrad-FE (LOESTRIN 24 FE) 1-20 MG-MCG(24) tablet; Take 1 tablet by mouth daily.  [facility], it was a pleasure meeting you today!  I will inform you of your results as soon as they are available.   Oral Contraception Information Oral contraceptive pills (OCPs) are medicines taken to prevent pregnancy. OCPs work by preventing the ovaries from releasing eggs. The hormones in OCPs also cause the cervical mucus to thicken, preventing the sperm from entering the uterus. The hormones also cause the uterine lining to become thin, not allowing a fertilized egg to attach to the inside of the uterus. OCPs are highly effective when taken exactly as prescribed. However, OCPs do not prevent sexually transmitted diseases (STDs). Safe sex practices, such as using condoms along with the pill, can help prevent STDs. Before taking the pill, you may have a physical exam and Pap test. Your health care provider may order blood tests. The health care provider will make  sure you are a good candidate for oral contraception. Discuss with your health care provider the possible side effects of the OCP you may be prescribed. When starting an OCP, it can take 2 to 3 months for the body to adjust to the changes in hormone levels in your body. Types of oral contraception  The combination pill-This pill contains estrogen and progestin (synthetic progesterone) hormones. The combination pill comes in 21-day, 28-day, or 91-day packs. Some types of combination pills are meant to be taken continuously (365-day pills). With 21-day packs, you do not take pills for 7 days after the last pill. With 28-day packs, the pill is taken every day. The last 7 pills are without hormones. Certain types of pills have more than 21 hormone-containing pills. With 91-day packs, the first 84 pills contain both hormones, and the last 7 pills contain no hormones or contain estrogen only.  The minipill-This pill contains the progesterone hormone only. The pill is taken every day continuously. It is very important to take the pill at the same time each day. The minipill comes in packs of 28 pills. All 28 pills contain the hormone. Advantages of oral contraceptive pills  Decreases premenstrual symptoms.  Treats menstrual period cramps.  Regulates the menstrual cycle.  Decreases a heavy menstrual flow.  May treatacne, depending on the type of pill.  Treats abnormal uterine bleeding.  Treats polycystic ovarian syndrome.  Treats endometriosis.  Can be used as emergency contraception. Things that can make oral contraceptive pills less effective OCPs can be less effective if:  You forget to take the pill at the same time every day.  You have a stomach or intestinal disease that lessens  the absorption of the pill.  You take OCPs with other medicines that make OCPs less effective, such as antibiotics, certain HIV medicines, and some seizure medicines.  You take expired OCPs.  You forget to  restart the pill on day 7, when using the packs of 21 pills.  Risks associated with oral contraceptive pills Oral contraceptive pills can sometimes cause side effects, such as:  Headache.  Nausea.  Breast tenderness.  Irregular bleeding or spotting.  Combination pills are also associated with a small increased risk of:  Blood clots.  Heart attack.  Stroke.  This information is not intended to replace advice given to you by your health care provider. Make sure you discuss any questions you have with your health care provider. Document Released: 10/03/2002 Document Revised: 12/19/2015 Document Reviewed: 01/01/2013 Elsevier Interactive Patient Education  [employer].

## 2017-11-16 NOTE — Progress Notes (Signed)
Dawn Young [birth year] [id number]        37 y.o.  G4P4L4 status post tubal ligation  RP: Menorrhagia with secondary anemia for pelvic ultrasound and sonohysterogram  HPI: Heavy periods every 4 to 5 weeks lasting 7 to 8 days with the first 3 4 days very heavy changing every 1-2 hours.  Her menstrual cycles have been every year for the last 6 to 8 months.  Hemoglobin was 9.9 on November 04, 2017.  Iron level low at 28 and percentage of saturation low at 9.   OB History  Gravida Para Term Preterm AB Living  4 4       4   SAB TAB Ectopic Multiple Live Births               # Outcome Date GA Lbr Len/2nd Weight Sex Delivery Anes PTL Lv  4 Para           3 Para           2 Para           1 Para             Past medical history,surgical history, problem list, medications, allergies, family history and social history were all reviewed and documented in the EPIC chart.   Directed ROS with pertinent positives and negatives documented in the history of present illness/assessment and plan.  Exam:  There were no vitals filed for this visit. General appearance:  Normal                                                                    Sono Infusion Hysterogram ( procedure note)   The initial transvaginal ultrasound demonstrated the following: T/V and T/a images.  Enlarged anteverted uterus with homogeneous measuring 14.07 x 7.28 x 5.62 cm.  Endometrial lining measured at 10.4 mm, on day 33 of her menses.  Right and left ovaries normal.  No free fluid in the posterior cul-de-sac.  The speculum  was inserted and the cervix cleansed with Betadine solution after confirming that patient has no allergies.A small sonohysterography catheterwas utilized.  Insertion was facilitated with ring forceps, using a spear-like motion the catheter was inserted to the fundus of the uterus. The speculum is then removed carefully to avoid dislodging the catheter. The catheter was flushed with sterile saline  delete prior to insertion to rid it of small amounts of air.the sterile saline solution was infused into the uterine cavity as a vaginal ultrasound probe was then placed in the vagina for full visualization of the uterine cavity from a transvaginal approach. The following was noted: Following injection of saline the endometrium is filled with no defect seen.  Excluding the fluid the endometrial lining is at 6.6 mm.  The catheter was then removed after retrieving some of the saline from the intrauterine cavity. An endometrial biopsy was done. Patient tolerated procedure well. She had received a tablet of Aleve for discomfort.    Assessment/Plan:  37 y.o. G4P4   1. Menorrhagia with regular cycle Menorrhagia with secondary anemia.  Hemoglobin 9.9 in April 2019.  Pelvic ultrasound within normal with a normal endometrial cavity per sonohysterogram.  Endometrial biopsy done none the less.  Findings discussed with patient.  Patient  reassured.  Counseling on management of heavy menses done with patient.  No contraindication to birth control pill as patient is not a smoker, no history of high blood pressure and no diagnosis of diabetes or hypercholesterolemia.  Only risk factor for cardiovascular disease is her obesity.  Progesterone only pill, Progesterone IUD, Nexplanon, Depo-Provera injections and endometrial ablation discussed with patient.  Patient opts for the birth control pill with low estrogen and progestin.  Lomedia 24 Fe 1/20 prescribed.  Usage, risks and benefits reviewed with patient.  2. Secondary anemia Recommend iron rich foods and iron sulfate supplement daily.  Other orders - Norethindrone Acetate-Ethinyl Estrad-FE (LOESTRIN 24 FE) 1-20 MG-MCG(24) tablet; Take 1 tablet by mouth daily.  Counseling on above issues and coordination of care more than 50% for 15 minutes.  Princess Bruins MD, 3:23 PM 11/16/2017

## 2018-02-02 ENCOUNTER — Other Ambulatory Visit: Payer: Self-pay | Admitting: Internal Medicine

## 2018-02-02 DIAGNOSIS — D500 Iron deficiency anemia secondary to blood loss (chronic): Secondary | ICD-10-CM

## 2018-02-07 ENCOUNTER — Other Ambulatory Visit: Payer: No Typology Code available for payment source | Admitting: Internal Medicine

## 2018-02-07 DIAGNOSIS — D500 Iron deficiency anemia secondary to blood loss (chronic): Secondary | ICD-10-CM

## 2018-02-08 LAB — IRON,TIBC AND FERRITIN PANEL
%SAT: 10 % (calc) — ABNORMAL LOW (ref 16–45)
Ferritin: 27 ng/mL (ref 16–154)
Iron: 31 ug/dL — ABNORMAL LOW (ref 40–190)
TIBC: 318 mcg/dL (calc) (ref 250–450)

## 2018-02-08 LAB — CBC WITH DIFFERENTIAL/PLATELET
Basophils Absolute: 28 cells/uL (ref 0–200)
Basophils Relative: 0.4 %
Eosinophils Absolute: 83 cells/uL (ref 15–500)
Eosinophils Relative: 1.2 %
HCT: 31 % — ABNORMAL LOW (ref 35.0–45.0)
Hemoglobin: 9.9 g/dL — ABNORMAL LOW (ref 11.7–15.5)
Lymphs Abs: 2091 cells/uL (ref 850–3900)
MCH: 24.1 pg — ABNORMAL LOW (ref 27.0–33.0)
MCHC: 31.9 g/dL — ABNORMAL LOW (ref 32.0–36.0)
MCV: 75.4 fL — ABNORMAL LOW (ref 80.0–100.0)
MPV: 10.3 fL (ref 7.5–12.5)
Monocytes Relative: 8.5 %
Neutro Abs: 4112 cells/uL (ref 1500–7800)
Neutrophils Relative %: 59.6 %
Platelets: 297 10*3/uL (ref 140–400)
RBC: 4.11 10*6/uL (ref 3.80–5.10)
RDW: 16.8 % — ABNORMAL HIGH (ref 11.0–15.0)
Total Lymphocyte: 30.3 %
WBC mixed population: 587 cells/uL (ref 200–950)
WBC: 6.9 10*3/uL (ref 3.8–10.8)

## 2018-02-11 ENCOUNTER — Encounter: Payer: Self-pay | Admitting: Internal Medicine

## 2018-02-11 ENCOUNTER — Ambulatory Visit (INDEPENDENT_AMBULATORY_CARE_PROVIDER_SITE_OTHER): Payer: No Typology Code available for payment source | Admitting: Internal Medicine

## 2018-02-11 VITALS — BP 140/98 | HR 76 | Ht 66.0 in | Wt 282.0 lb

## 2018-02-11 DIAGNOSIS — D500 Iron deficiency anemia secondary to blood loss (chronic): Secondary | ICD-10-CM

## 2018-02-11 NOTE — Progress Notes (Signed)
   Subjective:    Patient ID: Dawn Young, female    DOB: 1981/07/27, 37 y.o.   MRN: [id number]  HPI She has a history of menorrhagia.  Sees Dr. Dellis Filbert.  Still having heavy periods despite oral contraceptives.  At last visit we asked her to take iron sulfate but it is caused significant constipation that is not corrected with stool softeners.  I have recommended MiraLAX and changing to ferrous gluconate instead of ferrous sulfate.  She remains iron deficient.  Hemoglobin is still 9.9 g and was 9.9 g 3 months ago.  MCV has come up a little bit from 72.3-75.4.  Iron is low at 31 and previously was 28 3 months ago.  Ferritin is 27 and previously was 14 3 months ago.  She says she will contact Dr. Dellis Filbert about persistent menorrhagia.  Says she has been told she might need an ablation.    Review of Systems See above    Objective:   Physical Exam  Have not examined her today.  Spent 10 minutes speaking with her about these results and what to do with constipation due to iron supplementation      Assessment & Plan:  Iron deficiency anemia  Menorrhagia  Plan: Switch to ferrous gluconate and take MiraLAX daily to see if this will help with constipation.  Follow-up in 3 months with repeat labs and office visit.

## 2018-02-11 NOTE — Patient Instructions (Signed)
Change ferrous sulfate to ferrous gluconate over-the-counter and take as directed.  Follow-up in 3 months.  Try MiraLAX for constipation associated with iron supplementation.

## 2018-05-13 ENCOUNTER — Other Ambulatory Visit: Payer: Self-pay | Admitting: Internal Medicine

## 2018-05-13 DIAGNOSIS — D509 Iron deficiency anemia, unspecified: Secondary | ICD-10-CM

## 2018-05-17 ENCOUNTER — Other Ambulatory Visit: Payer: No Typology Code available for payment source | Admitting: Internal Medicine

## 2018-05-20 ENCOUNTER — Ambulatory Visit: Payer: No Typology Code available for payment source | Admitting: Internal Medicine

## 2018-05-30 ENCOUNTER — Encounter: Payer: Self-pay | Admitting: Women's Health

## 2018-05-30 ENCOUNTER — Ambulatory Visit (INDEPENDENT_AMBULATORY_CARE_PROVIDER_SITE_OTHER): Payer: No Typology Code available for payment source | Admitting: Women's Health

## 2018-05-30 VITALS — BP 126/80

## 2018-05-30 DIAGNOSIS — N920 Excessive and frequent menstruation with regular cycle: Secondary | ICD-10-CM

## 2018-05-30 NOTE — Progress Notes (Signed)
37 year old SBF G4, P4 with BTL presents with regular monthly 7-8 day heavy flow cycles requesting hysterectomy.  States cycles are extremely heavy passing clots, bleeding through her clothes and states cannot tolerate any longer.  10/2017 negative sonohysterogram.  Denies vaginal discharge, urinary symptoms, abdominal pain or fever.  Medical problems including iron deficiency anemia and obesity.  Normal TSH.  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  4 children ages 32-17 all doing well.  Same partner greater than 5 years.  Exam: Appears well.  Obese,  Menorrhagia/BTL  Plan: Options reviewed, reviewed best not to start with  hysterectomy, option of endometrial ablation reviewed/ procedure discussed, will check coverage and schedule with Dr. Dellis Filbert.  Mirena IUD also discussed.

## 2018-05-30 NOTE — Patient Instructions (Signed)
Endometrial Ablation Endometrial ablation is a procedure that destroys the thin inner layer of the lining of the uterus (endometrium). This procedure may be done:  To stop heavy periods.  To stop bleeding that is causing anemia.  To control irregular bleeding.  To treat bleeding caused by small tumors (fibroids) in the endometrium.  This procedure is often an alternative to major surgery, such as removal of the uterus and cervix (hysterectomy). As a result of this procedure:  You may not be able to have children. However, if you are premenopausal (you have not gone through menopause): ? You may still have a small chance of getting pregnant. ? You will need to use a reliable method of birth control after the procedure to prevent pregnancy.  You may stop having a menstrual period, or you may have only a small amount of bleeding during your period. Menstruation may return several years after the procedure.  Tell a health care provider about:  Any allergies you have.  All medicines you are taking, including vitamins, herbs, eye drops, creams, and over-the-counter medicines.  Any problems you or family members have had with the use of anesthetic medicines.  Any blood disorders you have.  Any surgeries you have had.  Any medical conditions you have. What are the risks? Generally, this is a safe procedure. However, problems may occur, including:  A hole (perforation) in the uterus or bowel.  Infection of the uterus, bladder, or vagina.  Bleeding.  Damage to other structures or organs.  An air bubble in the lung (air embolus).  Problems with pregnancy after the procedure.  Failure of the procedure.  Decreased ability to diagnose cancer in the endometrium.  What happens before the procedure?  You will have tests of your endometrium to make sure there are no pre-cancerous cells or cancer cells present.  You may have an ultrasound of the uterus.  You may be given  medicines to thin the endometrium.  Ask your health care provider about: ? Changing or stopping your regular medicines. This is especially important if you take diabetes medicines or blood thinners. ? Taking medicines such as aspirin and ibuprofen. These medicines can thin your blood. Do not take these medicines before your procedure if your doctor tells you not to.  Plan to have someone take you home from the hospital or clinic. What happens during the procedure?  You will lie on an exam table with your feet and legs supported as in a pelvic exam.  To lower your risk of infection: ? Your health care team will wash or sanitize their hands and put on germ-free (sterile) gloves. ? Your genital area will be washed with soap.  An IV tube will be inserted into one of your veins.  You will be given a medicine to help you relax (sedative).  A surgical instrument with a light and camera (resectoscope) will be inserted into your vagina and moved into your uterus. This allows your surgeon to see inside your uterus.  Endometrial tissue will be removed using one of the following methods: ? Radiofrequency. This method uses a radiofrequency-alternating electric current to remove the endometrium. ? Cryotherapy. This method uses extreme cold to freeze the endometrium. ? Heated-free liquid. This method uses a heated saltwater (saline) solution to remove the endometrium. ? Microwave. This method uses high-energy microwaves to heat up the endometrium and remove it. ? Thermal balloon. This method involves inserting a catheter with a balloon tip into the uterus. The balloon tip is   filled with heated fluid to remove the endometrium. The procedure may vary among health care providers and hospitals. What happens after the procedure?  Your blood pressure, heart rate, breathing rate, and blood oxygen level will be monitored until the medicines you were given have worn off.  As tissue healing occurs, you may  notice vaginal bleeding for 4-6 weeks after the procedure. You may also experience: ? Cramps. ? Thin, watery vaginal discharge that is light pink or brown in color. ? A need to urinate more frequently than usual. ? Nausea.  Do not drive for 24 hours if you were given a sedative.  Do not have sex or insert anything into your vagina until your health care provider approves. Summary  Endometrial ablation is done to treat the many causes of heavy menstrual bleeding.  The procedure may be done only after medications have been tried to control the bleeding.  Plan to have someone take you home from the hospital or clinic. This information is not intended to replace advice given to you by your health care provider. Make sure you discuss any questions you have with your health care provider. Document Released: 05/22/2004 Document Revised: 07/30/2016 Document Reviewed: 07/30/2016 Elsevier Interactive Patient Education  2017 Elsevier Inc.  

## 2018-05-31 ENCOUNTER — Telehealth: Payer: Self-pay

## 2018-05-31 NOTE — Telephone Encounter (Signed)
Patient saw [facility] yesterday and discussed endometrial ablation. She is ready to proceed with scheduling.  Please send surgery order. Thanks

## 2018-06-02 NOTE — Telephone Encounter (Signed)
Called and left detailed message in voice mail. Asked her to call me if any questions or assistance with scheduling appointment. Left my direct phone number. (all per DPR access note on file.)

## 2018-06-02 NOTE — Telephone Encounter (Signed)
Per Pelvic US at time of Sonohysterogram was measured at 14+ cm, too large for a Novasure Endometrial Ablation.  Please schedule patient for a visit with me.  I will review the Pelvic US images from 10/2017 to recommend the best surgical approach.

## 2018-06-22 MED FILL — BLISOVI 24 FE TABLET: 1-20 | 84 days supply | Qty: 84 | Fill #0

## 2018-08-02 ENCOUNTER — Encounter: Payer: Self-pay | Admitting: Internal Medicine

## 2018-08-02 ENCOUNTER — Telehealth: Payer: Self-pay | Admitting: Internal Medicine

## 2018-08-02 NOTE — Telephone Encounter (Signed)
Phone message re My Chart  appt made. Left message we do NOT book appts in My Chart. She needs to reschedule as her appt is double booked

## 2018-08-05 ENCOUNTER — Encounter: Payer: No Typology Code available for payment source | Admitting: Internal Medicine

## 2018-08-05 ENCOUNTER — Encounter: Payer: Self-pay | Admitting: Internal Medicine

## 2018-08-05 ENCOUNTER — Ambulatory Visit (INDEPENDENT_AMBULATORY_CARE_PROVIDER_SITE_OTHER): Payer: No Typology Code available for payment source | Admitting: Internal Medicine

## 2018-08-05 VITALS — BP 170/120 | HR 85 | Temp 98.2°F | Ht 65.5 in | Wt 280.0 lb

## 2018-08-05 DIAGNOSIS — D500 Iron deficiency anemia secondary to blood loss (chronic): Secondary | ICD-10-CM

## 2018-08-05 DIAGNOSIS — R030 Elevated blood-pressure reading, without diagnosis of hypertension: Secondary | ICD-10-CM

## 2018-08-05 DIAGNOSIS — R55 Syncope and collapse: Secondary | ICD-10-CM

## 2018-08-05 DIAGNOSIS — N920 Excessive and frequent menstruation with regular cycle: Secondary | ICD-10-CM | POA: Diagnosis not present

## 2018-08-05 MED ORDER — AMLODIPINE BESYLATE 5 MG PO TABS: 5.0000 mg | ORAL_TABLET | Freq: Every day | ORAL | 3 refills | Status: DC

## 2018-08-05 NOTE — Progress Notes (Signed)
   Subjective:    Patient ID: Dawn Young, female    DOB: [birth year], 38 y.o.   MRN: [id number]  HPI 38 year old Black Female began coming to the office in the spring 2019 and was found to have iron deficiency anemia.  Was having issues with menorrhagia.  Unfortunately she had issues taking iron supplement due to significant constipation despite trying stool softeners.  Recently she was tired from working.  Had laid down to get some rest and someone came to her door.  She jumped up quickly to answer the door and had a syncopal episode.  This was the first time she had ever had syncope.  She came to quite quickly.  She knew she had passed out and was oriented after the event.  We did an EKG today that showed normal sinus rhythm.  She also has had CBC which shows hemoglobin of 10.2 g, MCV of 74.3 and ferritin of 23.  Free T4 and TSH are normal as is C met.  My guess is she had syncope from orthostasis due to iron deficiency or vasovagal syncope.  In any case she has not had further episodes.  Her blood pressure is elevated significantly today at 170/120.  It was repeated.  She weighs 290 pounds and BMI is 45.89.  Last menstrual period was December 2019  Review of Systems see above     Objective:   Physical Exam Blood pressure 170/120, pulse 85, temperature 98.2 degrees orally weight 280 pounds Skin warm and dry.  Nodes none.  No thyromegaly.  Neck is supple.  No adenopathy.  Chest clear.  Cardiac exam regular rate and rhythm normal S1 and S2 without murmurs or gallops.  Extremities trace pitting edema of the lower extremities.  Neuro no focal deficits on brief neurological exam.      Assessment & Plan:  Single episode of syncope with sudden change in position?  Vasovagal syncope or orthostatic hypotension related to iron deficiency/volume depletion  Significant iron deficiency anemia secondary to menorrhagia.  Has not been able to tolerate iron supplements over-the-counter.  Has  developed constipation with no supplements.  Essential hypertension that is severe today  Plan: Start Norvasc 5 mg daily.  Is to monitor blood pressure at home and follow-up in 1 week.

## 2018-08-08 ENCOUNTER — Other Ambulatory Visit: Payer: Self-pay | Admitting: Internal Medicine

## 2018-08-08 DIAGNOSIS — D649 Anemia, unspecified: Secondary | ICD-10-CM

## 2018-08-09 LAB — CBC WITH DIFFERENTIAL/PLATELET
Absolute Monocytes: 601 cells/uL (ref 200–950)
Basophils Absolute: 23 cells/uL (ref 0–200)
Basophils Relative: 0.3 %
Eosinophils Absolute: 62 cells/uL (ref 15–500)
Eosinophils Relative: 0.8 %
HCT: 31.5 % — ABNORMAL LOW (ref 35.0–45.0)
Hemoglobin: 10.2 g/dL — ABNORMAL LOW (ref 11.7–15.5)
Lymphs Abs: 2551 cells/uL (ref 850–3900)
MCH: 24.1 pg — ABNORMAL LOW (ref 27.0–33.0)
MCHC: 32.4 g/dL (ref 32.0–36.0)
MCV: 74.3 fL — ABNORMAL LOW (ref 80.0–100.0)
MPV: 11.3 fL (ref 7.5–12.5)
Monocytes Relative: 7.7 %
Neutro Abs: 4563 cells/uL (ref 1500–7800)
Neutrophils Relative %: 58.5 %
Platelets: 305 10*3/uL (ref 140–400)
RBC: 4.24 10*6/uL (ref 3.80–5.10)
RDW: 16.5 % — ABNORMAL HIGH (ref 11.0–15.0)
Total Lymphocyte: 32.7 %
WBC: 7.8 10*3/uL (ref 3.8–10.8)

## 2018-08-09 LAB — FERRITIN: Ferritin: 23 ng/mL (ref 16–154)

## 2018-08-09 LAB — COMPLETE METABOLIC PANEL WITH GFR
AG Ratio: 1.2 (calc) (ref 1.0–2.5)
ALT: 11 U/L (ref 6–29)
AST: 10 U/L (ref 10–30)
Albumin: 4 g/dL (ref 3.6–5.1)
Alkaline phosphatase (APISO): 76 U/L (ref 33–115)
BUN: 12 mg/dL (ref 7–25)
CO2: 28 mmol/L (ref 20–32)
Calcium: 9.7 mg/dL (ref 8.6–10.2)
Chloride: 101 mmol/L (ref 98–110)
Creat: 0.7 mg/dL (ref 0.50–1.10)
GFR, Est African American: 130 mL/min/{1.73_m2} (ref >=60)
GFR, Est Non African American: 112 mL/min/{1.73_m2} (ref >=60)
Globulin: 3.3 g/dL (calc) (ref 1.9–3.7)
Glucose, Bld: 98 mg/dL (ref 65–99)
Potassium: 4.8 mmol/L (ref 3.5–5.3)
Sodium: 137 mmol/L (ref 135–146)
Total Bilirubin: 0.2 mg/dL (ref 0.2–1.2)
Total Protein: 7.3 g/dL (ref 6.1–8.1)

## 2018-08-09 LAB — TEST AUTHORIZATION

## 2018-08-09 LAB — T4, FREE: Free T4: 1.2 ng/dL (ref 0.8–1.8)

## 2018-08-09 LAB — TSH: TSH: 2.78 mIU/L

## 2018-08-12 ENCOUNTER — Encounter: Payer: Self-pay | Admitting: Internal Medicine

## 2018-08-12 ENCOUNTER — Ambulatory Visit (INDEPENDENT_AMBULATORY_CARE_PROVIDER_SITE_OTHER): Payer: No Typology Code available for payment source | Admitting: Internal Medicine

## 2018-08-12 VITALS — BP 140/90 | HR 80 | Ht 65.5 in | Wt 280.0 lb

## 2018-08-12 DIAGNOSIS — D500 Iron deficiency anemia secondary to blood loss (chronic): Secondary | ICD-10-CM

## 2018-08-12 DIAGNOSIS — K5909 Other constipation: Secondary | ICD-10-CM

## 2018-08-12 DIAGNOSIS — I10 Essential (primary) hypertension: Secondary | ICD-10-CM | POA: Diagnosis not present

## 2018-08-12 NOTE — Progress Notes (Signed)
   Subjective:    Patient ID: Dawn Young, female    DOB: February 20, 1981, 38 y.o.   MRN: [id number]  HPI 38 year old Black Female began coming to this office in April 2019.  Was diagnosed with iron deficiency anemia.  Had issues with menorrhagia and saw Dr. Dellis Filbert.  We had asked her to take iron sulfate but it caused significant constipation that did not correct with stool softeners.  Recommended ferrous gluconate instead of ferrous sulfate and recommended MiraLAX.  Iron deficiency has not improved.  Recently she was not feeling that well.  She was tired from work.  She had laid down to get some rest and someone came to the door.  She jumped up quickly and had a syncopal episode.  This frightened her.  She was evaluated January 10.  Was found to be significantly hypertensive with blood pressure 170/120 and was started on Norvasc 5 mg daily.  Blood pressure much better today at 140/90.  She weighs 280 pounds.  BMI is 45.89.  Also was found to be significantly iron deficient once again And anemic with hemoglobin 10.2 g MCV 74.3, ferritin of 23.  She had normal TSH.  She had normal C met.  Issues with constipation remain. Review of Systems see above     Objective:   Physical Exam Neck is supple without thyromegaly or carotid bruits.  Chest clear.  Cardiac exam regular rate and rhythm normal S1 and S2.  Extremities trace lower extremity edema.       Assessment & Plan:  Hypertension-improved  Syncope-likely vasovagal or orthostatic in nature  Morbid obesity- suggest evaluation by Dr. Leafy Ro  Iron deficiency- is back on iron supplementation  Constipation due to iron supplement-trial of Linzess  Plan: Follow-up in early February

## 2018-08-20 ENCOUNTER — Encounter: Payer: Self-pay | Admitting: Internal Medicine

## 2018-08-20 NOTE — Patient Instructions (Signed)
Trial of Linzess for constipation.  Continue Norvasc.  Follow-up in early February.  Continue with iron supplement.

## 2018-08-20 NOTE — Patient Instructions (Signed)
Start Norvasc 5 mg daily and follow-up in 1 week

## 2018-09-02 DIAGNOSIS — R030 Elevated blood-pressure reading, without diagnosis of hypertension: Secondary | ICD-10-CM

## 2018-09-02 MED ORDER — AMLODIPINE BESYLATE 5 MG PO TABS: 5.0000 mg | ORAL_TABLET | Freq: Every day | ORAL | 1 refills | Status: DC

## 2018-09-02 NOTE — Telephone Encounter (Signed)
Amlodine sent to [facility] per pt request

## 2018-09-23 ENCOUNTER — Ambulatory Visit: Payer: No Typology Code available for payment source | Admitting: Internal Medicine

## 2019-01-16 ENCOUNTER — Emergency Department (HOSPITAL_COMMUNITY): Payer: No Typology Code available for payment source

## 2019-01-16 ENCOUNTER — Encounter (HOSPITAL_COMMUNITY): Payer: Self-pay | Admitting: Radiology

## 2019-01-16 ENCOUNTER — Emergency Department (HOSPITAL_COMMUNITY)
Admission: EM | Admit: 2019-01-16 | Discharge: 2019-01-16 | Disposition: A | Discharge status: Home / Self Care | Payer: No Typology Code available for payment source | Attending: Emergency Medicine | Admitting: Emergency Medicine

## 2019-01-16 ENCOUNTER — Other Ambulatory Visit: Payer: Self-pay

## 2019-01-16 DIAGNOSIS — F17210 Nicotine dependence, cigarettes, uncomplicated: Secondary | ICD-10-CM | POA: Diagnosis not present

## 2019-01-16 DIAGNOSIS — R1013 Epigastric pain: Secondary | ICD-10-CM | POA: Diagnosis not present

## 2019-01-16 LAB — CBC
HCT: 33.1 % — ABNORMAL LOW (ref 36.0–46.0)
Hemoglobin: 10.7 g/dL — ABNORMAL LOW (ref 12.0–15.0)
MCH: 24 pg — ABNORMAL LOW (ref 26.0–34.0)
MCHC: 32.3 g/dL (ref 30.0–36.0)
MCV: 74.4 fL — ABNORMAL LOW (ref 80.0–100.0)
Platelets: 288 10*3/uL (ref 150–400)
RBC: 4.45 MIL/uL (ref 3.87–5.11)
RDW: 16.9 % — ABNORMAL HIGH (ref 11.5–15.5)
WBC: 10 10*3/uL (ref 4.0–10.5)
nRBC: 0 % (ref 0.0–0.2)

## 2019-01-16 LAB — COMPREHENSIVE METABOLIC PANEL
ALT: 18 U/L (ref 0–44)
AST: 15 U/L (ref 15–41)
Albumin: 3.4 g/dL — ABNORMAL LOW (ref 3.5–5.0)
Alkaline Phosphatase: 86 U/L (ref 38–126)
Anion gap: 9 (ref 5–15)
BUN: 9 mg/dL (ref 6–20)
CO2: 23 mmol/L (ref 22–32)
Calcium: 9.4 mg/dL (ref 8.9–10.3)
Chloride: 102 mmol/L (ref 98–111)
Creatinine, Ser: 0.68 mg/dL (ref 0.44–1.00)
GFR calc Af Amer: >60 mL/min (ref >60)
GFR calc non Af Amer: >60 mL/min (ref >60)
Glucose, Bld: 175 mg/dL — ABNORMAL HIGH (ref 70–99)
Potassium: 4.1 mmol/L (ref 3.5–5.1)
Sodium: 134 mmol/L — ABNORMAL LOW (ref 135–145)
Total Bilirubin: 0.7 mg/dL (ref 0.3–1.2)
Total Protein: 7.6 g/dL (ref 6.5–8.1)

## 2019-01-16 LAB — URINALYSIS, ROUTINE W REFLEX MICROSCOPIC
Bilirubin Urine: NEGATIVE
Glucose, UA: NEGATIVE mg/dL
Hgb urine dipstick: NEGATIVE
Ketones, ur: NEGATIVE mg/dL
Leukocytes,Ua: NEGATIVE
Nitrite: NEGATIVE
Protein, ur: NEGATIVE mg/dL
Specific Gravity, Urine: 1.013 (ref 1.005–1.030)
pH: 6 (ref 5.0–8.0)

## 2019-01-16 LAB — PREGNANCY, URINE: Preg Test, Ur: NEGATIVE

## 2019-01-16 LAB — LIPASE, BLOOD: Lipase: 31 U/L (ref 11–51)

## 2019-01-16 LAB — I-STAT BETA HCG BLOOD, ED (MC, WL, AP ONLY): I-stat hCG, quantitative: <5 m[IU]/mL (ref <5)

## 2019-01-16 MED ORDER — ONDANSETRON HCL 4 MG/2ML IJ SOLN
4.0000 mg | Freq: Once | INTRAMUSCULAR | Status: AC
  Administered 2019-01-16: 07:00:00 4 mg via INTRAVENOUS
  Filled 2019-01-16: qty 2

## 2019-01-16 MED ORDER — ONDANSETRON 4 MG PO TBDP: 4.0000 mg | ORAL_TABLET | Freq: Three times a day (TID) | ORAL | 0 refills | Status: DC | PRN

## 2019-01-16 MED ORDER — SODIUM CHLORIDE 0.9 % IV BOLUS
1000.0000 mL | Freq: Once | INTRAVENOUS | Status: AC
  Administered 2019-01-16: 1000 mL via INTRAVENOUS

## 2019-01-16 MED ORDER — MORPHINE SULFATE (PF) 4 MG/ML IV SOLN
4.0000 mg | Freq: Once | INTRAVENOUS | Status: AC
  Administered 2019-01-16: 10:00:00 4 mg via INTRAVENOUS
  Filled 2019-01-16: qty 1

## 2019-01-16 MED ORDER — FAMOTIDINE 20 MG PO TABS: 20.0000 mg | ORAL_TABLET | Freq: Two times a day (BID) | ORAL | 0 refills | Status: DC

## 2019-01-16 MED ORDER — MORPHINE SULFATE (PF) 4 MG/ML IV SOLN
4.0000 mg | Freq: Once | INTRAVENOUS | Status: AC
  Administered 2019-01-16: 07:00:00 4 mg via INTRAVENOUS
  Filled 2019-01-16: qty 1

## 2019-01-16 MED ORDER — IOHEXOL 300 MG/ML  SOLN
125.0000 mL | Freq: Once | INTRAMUSCULAR | Status: AC | PRN
  Administered 2019-01-16: 07:00:00 125 mL via INTRAVENOUS

## 2019-01-16 MED ORDER — SUCRALFATE 1 G PO TABS: 1.0000 g | ORAL_TABLET | Freq: Three times a day (TID) | ORAL | 0 refills | Status: DC

## 2019-01-16 MED FILL — SUCRALFATE 1 GM TABLET: 1 | 3 days supply | Qty: 10 | Fill #0

## 2019-01-16 MED FILL — ONDANSETRON ODT 4 MG TABLET: 4 | 2 days supply | Qty: 5 | Fill #0

## 2019-01-16 MED FILL — SM ACID REDUCER 20 MG TAB: 20 | 12 days supply | Qty: 25 | Fill #0

## 2019-01-16 NOTE — ED Notes (Signed)
DC instructions discussed with pt verbalizing understanding. Pt home stable with boyfriend via wc.

## 2019-01-16 NOTE — ED Notes (Signed)
Patient transported to CT 

## 2019-01-16 NOTE — ED Notes (Signed)
Pt taking po fluids.

## 2019-01-16 NOTE — ED Provider Notes (Signed)
[place] EMERGENCY DEPARTMENT Provider Note   CSN: [id number] Arrival date & time: 01/16/19  0426     History   Chief Complaint Chief Complaint  Patient presents with  . Abdominal Pain    HPI Dawn Young is a 38 y.o. female.     Patient presents to the emergency department with a chief complaint of epigastric abdominal pain.  She states pain started yesterday and has progressively worsened until now.  She tried taking some Flexeril with no relief.  She states that the pain feels as bad as when she had her gallbladder taken out.  She denies any fevers or chills.  States that she did have some alcohol approximately 2 to 3 days ago.  She reports associated nausea, but denies any vomiting or diarrhea.  Denies any vaginal discharge, bleeding, or dysuria.  She states that the pain does radiate into her back.  The history is provided by the patient. No language interpreter was used.    Past Medical History:  Diagnosis Date  . History of kidney stones   . Hx of migraine headaches   . Hx of migraines     Patient Active Problem List   Diagnosis Date Noted  . Chronic pain of both knees 09/17/2017  . Acute bilateral low back pain without sciatica 09/17/2017  . Acute lumbar myofascial strain 09/17/2017  . Breast hypertrophy in female 06/24/2017  . Routine general medical examination at a health care facility 12/26/2015  . Thoracic back pain 12/26/2015  . Vitamin D deficiency 12/26/2015  . Iron deficiency anemia 12/26/2015  . Obesity 08/09/2013    Past Surgical History:  Procedure Laterality Date  . BREAST REDUCTION SURGERY Bilateral 06/24/2017   Procedure: MAMMARY REDUCTION  (BREAST);  Surgeon: Crissie Reese, MD;  Location: [place];  Service: Plastics;  Laterality: Bilateral;  . BREAST SURGERY Bilateral 05/2017  . CESAREAN SECTION  03, 05, 06   x's 3  . CHOLECYSTECTOMY  2007  . TUBAL LIGATION  2006     OB History    Gravida  4   Para  4   Term       Preterm      AB      Living  4     SAB      TAB      Ectopic      Multiple      Live Births               Home Medications    Prior to Admission medications   Medication Sig Start Date End Date Taking? Authorizing Provider  amLODipine (NORVASC) 5 MG tablet Take 1 tablet (5 mg total) by mouth daily. 09/02/18   Elby Showers, MD    Family History Family History  Problem Relation Age of Onset  . Healthy Mother   . Healthy Father   . Gout Father   . Healthy Maternal Grandmother   . Healthy Maternal Grandfather   . Healthy Paternal Grandmother   . Healthy Paternal Grandfather     Social History Social History   Tobacco Use  . Smoking status: Light Tobacco Smoker    Packs/day: 0.10    Years: 5.00    Pack years: 0.50    Types: Cigarettes  . Smokeless tobacco: Never Used  . Tobacco comment: 2 cigars a week  Substance Use Topics  . Alcohol use: Yes    Comment: occasional  . Drug use: No  Allergies   Patient has no known allergies.   Review of Systems Review of Systems  All other systems reviewed and are negative.    Physical Exam Updated Vital Signs BP (!) 156/91 (BP Location: Right Arm)   Pulse 69   Temp 98.6 F (37 C) (Oral)   Resp 18   Ht 5\' 5"  (1.651 m)   Wt 131.5 kg   SpO2 100%   BMI 48.26 kg/m   Physical Exam Vitals signs and nursing note reviewed.  Constitutional:      General: She is not in acute distress.    Appearance: She is well-developed.  HENT:     Head: Normocephalic and atraumatic.  Eyes:     Conjunctiva/sclera: Conjunctivae normal.  Neck:     Musculoskeletal: Neck supple.  Cardiovascular:     Rate and Rhythm: Normal rate and regular rhythm.     Heart sounds: No murmur.  Pulmonary:     Effort: Pulmonary effort is normal. No respiratory distress.     Breath sounds: Normal breath sounds.  Abdominal:     Palpations: Abdomen is soft.     Tenderness: There is abdominal tenderness.     Comments: Epigastric  abdominal tenderness, no lower abdominal tenderness  Musculoskeletal: Normal range of motion.  Skin:    General: Skin is warm and dry.  Neurological:     Mental Status: She is alert and oriented to person, place, and time.  Psychiatric:        Mood and Affect: Mood normal.        Behavior: Behavior normal.        Thought Content: Thought content normal.        Judgment: Judgment normal.      ED Treatments / Results  Labs (all labs ordered are listed, but only abnormal results are displayed) Labs Reviewed  COMPREHENSIVE METABOLIC PANEL - Abnormal; Notable for the following components:      Result Value   Sodium 134 (*)    Glucose, Bld 175 (*)    Albumin 3.4 (*)    All other components within normal limits  CBC - Abnormal; Notable for the following components:   Hemoglobin 10.7 (*)    HCT 33.1 (*)    MCV 74.4 (*)    MCH 24.0 (*)    RDW 16.9 (*)    All other components within normal limits  LIPASE, BLOOD  URINALYSIS, ROUTINE W REFLEX MICROSCOPIC  I-STAT BETA HCG BLOOD, ED ([facility], [facility], [facility] ONLY)  I-STAT BETA HCG BLOOD, ED ([facility], [facility], [facility] ONLY)    EKG    Radiology No results found.  Procedures Procedures (including critical care time)  Medications Ordered in ED Medications  morphine 4 MG/ML injection 4 mg (has no administration in time range)  ondansetron (ZOFRAN) injection 4 mg (has no administration in time range)  sodium chloride 0.9 % bolus 1,000 mL (has no administration in time range)     Initial Impression / Assessment and Plan / ED Course  I have reviewed the triage vital signs and the nursing notes.  Pertinent labs & imaging results that were available during my care of the patient were reviewed by me and considered in my medical decision making (see chart for details).        Patient with epigastric abdominal pain.  Started yesterday.  History of prior abdominal surgery including cholecystectomy and multiple cesarean sections.  She is tender on my exam.   Laboratory work-up however is thus far reassuring with  the exception of elevated blood glucose at 175.  She reports not being a diabetic.  Will check urinalysis.  Pregnancy test still pending as well.  Will check CT to evaluate for obstruction pancreatitis, or other etiology causing acute abdominal pain.  Patient understands and agrees the plan.  Patient signed out to [place], [place], who will continue care.  Final Clinical Impressions(s) / ED Diagnoses   Final diagnoses:  Epigastric pain    ED Discharge Orders    None       Montine Circle, PA-C 01/16/19 4643    Ward, Delice Bison, DO 01/16/19 0710

## 2019-01-16 NOTE — Discharge Instructions (Addendum)
°  You blood sugar was high today.  Please follow-up with your doctor, you may need to be tested for diabetes.  You will need to have an MRCP done to evaluate the dilation of your pancreatic duct.  Please call [facility] listed below to schedule this in for follow-up. You can take the medications as prescribed to help with your symptoms. Return to the ED if you start to develop a fever, worsening pain, blood in your stool, chest pain or shortness of breath.

## 2019-01-16 NOTE — ED Provider Notes (Signed)
Physical Exam  BP (!) 161/91 (BP Location: Right Wrist)   Pulse (!) 58   Temp 98.6 F (37 C) (Oral)   Resp 18   Ht 5\' 5"  (1.651 m)   Wt 131.5 kg   SpO2 100%   BMI 48.26 kg/m   Physical Exam Vitals signs and nursing note reviewed.  Constitutional:      General: She is not in acute distress.    Appearance: She is well-developed. She is not diaphoretic.  HENT:     Head: Normocephalic and atraumatic.  Eyes:     General: No scleral icterus.    Conjunctiva/sclera: Conjunctivae normal.  Neck:     Musculoskeletal: Normal range of motion.  Pulmonary:     Effort: Pulmonary effort is normal. No respiratory distress.  Abdominal:     Tenderness: There is abdominal tenderness in the epigastric area.  Skin:    Findings: No rash.  Neurological:     Mental Status: She is alert.     ED Course/Procedures     Procedures  MDM  Care handed off from previous provider PA [place]. Please see their note for further detail. Briefly patient is a 38yo F who is presenting to ED with a chief complaint of epigastric abdominal pain.  States that the pain feels similar to her episodes prior to her cholecystectomy.  She does endorse alcohol use 2 to 3 days ago.  Reports nausea but denies any vomiting or diarrhea.  Work-up reassuring including lipase normal.  Glucose slightly elevated at 175.  Plan is to obtain CT of the abdomen pelvis to evaluate for cause of epigastric pain.  If negative, patient will be discharged home with supportive treatment.  We will have her follow-up with PCP regarding her blood sugar.  8:24 AM Ct Abdomen Pelvis W Contrast  Result Date: 01/16/2019 CLINICAL DATA:  Acute generalized abdominal pain EXAM: CT ABDOMEN AND PELVIS WITH CONTRAST TECHNIQUE: Multidetector CT imaging of the abdomen and pelvis was performed using the standard protocol following bolus administration of intravenous contrast. CONTRAST:  176mL OMNIPAQUE IOHEXOL 300 MG/ML  SOLN COMPARISON:  None. FINDINGS: Lower  chest: Small subpleural nodule in the right lower lobe, likely lymph node. Hepatobiliary: Dense hepatic steatosis, especially about the hepatic fissure.Dilated main pancreatic duct with abrupt transition at the neck. The duct is dilated to 4 mm. No discrete underlying stone or mass is seen. No evidence of acute inflammation. Pancreas: Unremarkable. Spleen: Unremarkable. Adrenals/Urinary Tract: Negative adrenals. No hydronephrosis or stone. 14 mm right lower pole renal cyst. Unremarkable bladder. Stomach/Bowel:  No obstruction. No appendicitis. Vascular/Lymphatic: No acute vascular abnormality. No mass or adenopathy. Reproductive:Tubal ligation. Other: No ascites or pneumoperitoneum. Musculoskeletal: No acute abnormalities. IMPRESSION: 1. 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 No underlying mass is seen, but recommend MRCP. 2. Marked hepatic steatosis. Electronically Signed   By: Monte Fantasia M.D.   On: 01/16/2019 07:47    CT of the abdomen pelvis shows dilated pancreatic duct recommend MRCP.  On recheck, patient states that pain has improved but continues to be tender in epigastric area with palpation.  I will consult GI for further recommendations.  9:25 AM Spoke to Dr. Michail Sermon of [facility].  He states that patient can potentially follow-up outpatient for the MRCP completed.  States that her pain is controlled, she is able to tolerate fluids it is reasonable for outpatient follow-up.  Patient given another round of morphine and p.o. challenge.  She was able to tolerate p.o. without  difficulty and has had improvement in her symptoms.  She agrees to follow-up outpatient.  In the meantime we will give symptomatic treatment and strict return precautions. Patient is hemodynamically stable, in NAD, and able to ambulate in the ED. Evaluation does not show pathology that would require ongoing emergent intervention or inpatient treatment. I explained the diagnosis to the patient.  Pain has been managed and has no complaints prior to discharge. Patient is comfortable with above plan and is stable for discharge at this time. All questions were answered prior to disposition. Strict return precautions for returning to the ED were discussed. Encouraged follow up with PCP.   An After Visit Summary was printed and given to the patient.   Portions of this note were generated with [employer]. Dictation errors may occur despite best attempts at proofreading.    Dawn Young, Dawn Young 01/16/19 8916    Dawn Merino, MD 01/30/19 1200

## 2019-01-16 NOTE — ED Triage Notes (Signed)
Patient c/o abd pain (epgastric pain) that radiates to back. States that it feels like a gallbladder issue but hers has been removed. Also c/o nausea.

## 2019-01-18 ENCOUNTER — Emergency Department (HOSPITAL_COMMUNITY)
Admission: EM | Admit: 2019-01-18 | Discharge: 2019-01-18 | Disposition: A | Discharge status: Home / Self Care | Payer: No Typology Code available for payment source | Attending: Emergency Medicine | Admitting: Emergency Medicine

## 2019-01-18 DIAGNOSIS — Z79899 Other long term (current) drug therapy: Secondary | ICD-10-CM | POA: Insufficient documentation

## 2019-01-18 DIAGNOSIS — R112 Nausea with vomiting, unspecified: Secondary | ICD-10-CM | POA: Insufficient documentation

## 2019-01-18 DIAGNOSIS — R1013 Epigastric pain: Secondary | ICD-10-CM | POA: Diagnosis not present

## 2019-01-18 DIAGNOSIS — F17210 Nicotine dependence, cigarettes, uncomplicated: Secondary | ICD-10-CM | POA: Insufficient documentation

## 2019-01-18 DIAGNOSIS — K8689 Other specified diseases of pancreas: Secondary | ICD-10-CM | POA: Diagnosis not present

## 2019-01-18 DIAGNOSIS — Z87442 Personal history of urinary calculi: Secondary | ICD-10-CM | POA: Diagnosis not present

## 2019-01-18 LAB — COMPREHENSIVE METABOLIC PANEL
ALT: 20 U/L (ref 0–44)
AST: 19 U/L (ref 15–41)
Albumin: 3.9 g/dL (ref 3.5–5.0)
Alkaline Phosphatase: 100 U/L (ref 38–126)
Anion gap: 11 (ref 5–15)
BUN: 8 mg/dL (ref 6–20)
CO2: 22 mmol/L (ref 22–32)
Calcium: 9.6 mg/dL (ref 8.9–10.3)
Chloride: 103 mmol/L (ref 98–111)
Creatinine, Ser: 0.9 mg/dL (ref 0.44–1.00)
GFR calc Af Amer: >60 mL/min (ref >60)
GFR calc non Af Amer: >60 mL/min (ref >60)
Glucose, Bld: 137 mg/dL — ABNORMAL HIGH (ref 70–99)
Potassium: 4 mmol/L (ref 3.5–5.1)
Sodium: 136 mmol/L (ref 135–145)
Total Bilirubin: 0.6 mg/dL (ref 0.3–1.2)
Total Protein: 8.6 g/dL — ABNORMAL HIGH (ref 6.5–8.1)

## 2019-01-18 LAB — I-STAT BETA HCG BLOOD, ED (MC, WL, AP ONLY): I-stat hCG, quantitative: <5 m[IU]/mL (ref <5)

## 2019-01-18 LAB — CBC WITH DIFFERENTIAL/PLATELET
Abs Immature Granulocytes: 0.02 10*3/uL (ref 0.00–0.07)
Basophils Absolute: 0 10*3/uL (ref 0.0–0.1)
Basophils Relative: 0 %
Eosinophils Absolute: 0 10*3/uL (ref 0.0–0.5)
Eosinophils Relative: 0 %
HCT: 37.8 % (ref 36.0–46.0)
Hemoglobin: 11.9 g/dL — ABNORMAL LOW (ref 12.0–15.0)
Immature Granulocytes: 0 %
Lymphocytes Relative: 20 %
Lymphs Abs: 1.9 10*3/uL (ref 0.7–4.0)
MCH: 24.2 pg — ABNORMAL LOW (ref 26.0–34.0)
MCHC: 31.5 g/dL (ref 30.0–36.0)
MCV: 76.8 fL — ABNORMAL LOW (ref 80.0–100.0)
Monocytes Absolute: 0.5 10*3/uL (ref 0.1–1.0)
Monocytes Relative: 6 %
Neutro Abs: 6.8 10*3/uL (ref 1.7–7.7)
Neutrophils Relative %: 74 %
Platelets: 343 10*3/uL (ref 150–400)
RBC: 4.92 MIL/uL (ref 3.87–5.11)
RDW: 17 % — ABNORMAL HIGH (ref 11.5–15.5)
WBC: 9.2 10*3/uL (ref 4.0–10.5)
nRBC: 0 % (ref 0.0–0.2)

## 2019-01-18 LAB — URINALYSIS, ROUTINE W REFLEX MICROSCOPIC
Bacteria, UA: NONE SEEN
Bilirubin Urine: NEGATIVE
Glucose, UA: 50 mg/dL — AB
Hgb urine dipstick: NEGATIVE
Ketones, ur: 80 mg/dL — AB
Leukocytes,Ua: NEGATIVE
Nitrite: NEGATIVE
Protein, ur: 100 mg/dL — AB
Specific Gravity, Urine: 1.019 (ref 1.005–1.030)
pH: 5 (ref 5.0–8.0)

## 2019-01-18 LAB — LIPASE, BLOOD: Lipase: 25 U/L (ref 11–51)

## 2019-01-18 MED ORDER — TRAMADOL HCL 50 MG PO TABS: 50.0000 mg | ORAL_TABLET | Freq: Four times a day (QID) | ORAL | 0 refills | Status: DC | PRN

## 2019-01-18 MED ORDER — TRAMADOL HCL 50 MG PO TABS
50.0000 mg | ORAL_TABLET | Freq: Once | ORAL | Status: AC
  Administered 2019-01-18: 50 mg via ORAL
  Filled 2019-01-18: qty 1

## 2019-01-18 MED ORDER — ONDANSETRON 4 MG PO TBDP
4.0000 mg | ORAL_TABLET | Freq: Once | ORAL | Status: AC
  Administered 2019-01-18: 4 mg via ORAL
  Filled 2019-01-18: qty 1

## 2019-01-18 NOTE — ED Notes (Signed)
Pt has phone and is able to notify relatives

## 2019-01-18 NOTE — ED Provider Notes (Signed)
[place] EMERGENCY DEPARTMENT Provider Note   CSN: [id number] Arrival date & time: 01/18/19  1212     History   Chief Complaint No chief complaint on file.   HPI Dawn Young is a 38 y.o. female.     The history is provided by the patient and medical records. No language interpreter was used.     38 year old female with history of kidney stones, prior cholecystectomy presenting for evaluation of abdominal pain.  Patient developed epigastric abdominal pain for the past 4 to 5 days.  Was seen in the ED 2 days ago for his symptoms.  At that time, CT scan of the abdomen pelvis was obtained demonstrating dilated pancreatic ducts with recommendation for MRCP.  GI specialist, Dr. Michail Sermon, was consulted who recommend patient to follow-up outpatient for MRCP.  Patient received symptomatic treatment in the ED and her symptoms did improved.  She was discharged.  Patient reports she felt okay yesterday however today she has had persistent nausea, several episodes of nonbloody nonbilious vomiting, with increased epigastric pain.  Pain is described as a sharp nonradiating 6 out of 10 pain.  She tries taking Zofran without adequate relief.  She does not complain of fever, diarrhea, constipation or dysuria.  She did reach out to GI specialist but have not had a chance to schedule MRCP yet.  She reach out to her PCP and spoke with the nurse who recommend patient to come back to the ER.  She denies alcohol abuse, states last alcohol use was 5 days ago.  Past Medical History:  Diagnosis Date  . History of kidney stones   . Hx of migraine headaches   . Hx of migraines     Patient Active Problem List   Diagnosis Date Noted  . Chronic pain of both knees 09/17/2017  . Acute bilateral low back pain without sciatica 09/17/2017  . Acute lumbar myofascial strain 09/17/2017  . Breast hypertrophy in female 06/24/2017  . Routine general medical examination at a health care facility  12/26/2015  . Thoracic back pain 12/26/2015  . Vitamin D deficiency 12/26/2015  . Iron deficiency anemia 12/26/2015  . Obesity 08/09/2013    Past Surgical History:  Procedure Laterality Date  . BREAST REDUCTION SURGERY Bilateral 06/24/2017   Procedure: MAMMARY REDUCTION  (BREAST);  Surgeon: Crissie Reese, MD;  Location: [place];  Service: Plastics;  Laterality: Bilateral;  . BREAST SURGERY Bilateral 05/2017  . CESAREAN SECTION  03, 05, 06   x's 3  . CHOLECYSTECTOMY  2007  . TUBAL LIGATION  2006     OB History    Gravida  4   Para  4   Term      Preterm      AB      Living  4     SAB      TAB      Ectopic      Multiple      Live Births               Home Medications    Prior to Admission medications   Medication Sig Start Date End Date Taking? Authorizing Provider  amLODipine (NORVASC) 5 MG tablet Take 1 tablet (5 mg total) by mouth daily. 09/02/18   Elby Showers, MD  famotidine (PEPCID) 20 MG tablet Take 1 tablet (20 mg total) by mouth 2 (two) times daily. 01/16/19   Khatri, Hina, PA-C  ondansetron (ZOFRAN ODT) 4 MG disintegrating tablet Take  1 tablet (4 mg total) by mouth every 8 (eight) hours as needed for nausea or vomiting. 01/16/19   Khatri, Hina, PA-C  sucralfate (CARAFATE) 1 g tablet Take 1 tablet (1 g total) by mouth 4 (four) times daily -  with meals and at bedtime. 01/16/19   Delia Heady, PA-C    Family History Family History  Problem Relation Age of Onset  . Healthy Mother   . Healthy Father   . Gout Father   . Healthy Maternal Grandmother   . Healthy Maternal Grandfather   . Healthy Paternal Grandmother   . Healthy Paternal Grandfather     Social History Social History   Tobacco Use  . Smoking status: Light Tobacco Smoker    Packs/day: 0.10    Years: 5.00    Pack years: 0.50    Types: Cigarettes  . Smokeless tobacco: Never Used  . Tobacco comment: 2 cigars a week  Substance Use Topics  . Alcohol use: Yes    Comment: occasional   . Drug use: No     Allergies   Patient has no known allergies.   Review of Systems Review of Systems  All other systems reviewed and are negative.    Physical Exam Updated Vital Signs BP (!) 174/93 (BP Location: Left Arm)   Pulse 69   Temp 98.9 F (37.2 C) (Oral)   Resp 19   SpO2 98%   Physical Exam Vitals signs and nursing note reviewed.  Constitutional:      General: She is not in acute distress.    Appearance: She is well-developed. She is obese.  HENT:     Head: Atraumatic.     Mouth/Throat:     Mouth: Mucous membranes are moist.  Eyes:     Conjunctiva/sclera: Conjunctivae normal.  Neck:     Musculoskeletal: Neck supple.  Cardiovascular:     Rate and Rhythm: Normal rate and regular rhythm.     Pulses: Normal pulses.     Heart sounds: Normal heart sounds.  Pulmonary:     Effort: Pulmonary effort is normal.     Breath sounds: Normal breath sounds. No wheezing or rhonchi.  Abdominal:     Palpations: Abdomen is soft.     Tenderness: There is abdominal tenderness (Mild epigastric tenderness without guarding or rebound tenderness).  Skin:    Findings: No rash.  Neurological:     Mental Status: She is alert.      ED Treatments / Results  Labs (all labs ordered are listed, but only abnormal results are displayed) Labs Reviewed  COMPREHENSIVE METABOLIC PANEL - Abnormal; Notable for the following components:      Result Value   Glucose, Bld 137 (*)    Total Protein 8.6 (*)    All other components within normal limits  CBC WITH DIFFERENTIAL/PLATELET - Abnormal; Notable for the following components:   Hemoglobin 11.9 (*)    MCV 76.8 (*)    MCH 24.2 (*)    RDW 17.0 (*)    All other components within normal limits  URINALYSIS, ROUTINE W REFLEX MICROSCOPIC - Abnormal; Notable for the following components:   APPearance HAZY (*)    Glucose, UA 50 (*)    Ketones, ur 80 (*)    Protein, ur 100 (*)    All other components within normal limits  LIPASE, BLOOD   I-STAT BETA HCG BLOOD, ED ([facility], [facility], [facility] ONLY)    EKG    Radiology No results found.  Procedures Procedures (including  critical care time)  Medications Ordered in ED Medications  ondansetron (ZOFRAN-ODT) disintegrating tablet 4 mg (4 mg Oral Given 01/18/19 1632)  traMADol (ULTRAM) tablet 50 mg (50 mg Oral Given 01/18/19 1632)     Initial Impression / Assessment and Plan / ED Course  I have reviewed the triage vital signs and the nursing notes.  Pertinent labs & imaging results that were available during my care of the patient were reviewed by me and considered in my medical decision making (see chart for details).        BP (!) 174/93 (BP Location: Left Arm)   Pulse 69   Temp 98.9 F (37.2 C) (Oral)   Resp 19   SpO2 98%    Final Clinical Impressions(s) / ED Diagnoses   Final diagnoses:  Epigastric pain    ED Discharge Orders    None     4:20 PM Patient was diagnosed with dilated pancreatic duct 2 days ago causing epigastric abdominal pain with associated nausea and vomiting.  She will benefit from an MRCP going to GI specialist.  This can be done outpatient however she returns today due to worsening abdominal pain along with nausea not well controlled by Zofran.  She felt that if she received adequate pain control she can follow-up with GI specialist for further care.  Her labs today are reassuring.  We will give antinausea medication along with tramadol for pain control.  If patient tolerates p.o. will discharge home with a short course of pain medication for pain management while she awaits for outpatient follow-up.  5:54 PM Patient tolerates p.o., pain is improving with treatment.  She feels comfortable going home.  She will follow-up closely with GI specialist.  Return precaution discussed.   Domenic Moras, PA-C 01/18/19 1754    Maudie Flakes, MD 01/18/19 [phone]

## 2019-01-18 NOTE — ED Triage Notes (Signed)
Pt back to the ED today with continued complaints of abd pain , GI has recommended MRCP but states that she can not wait to do that out pt due to the pain

## 2019-01-18 NOTE — Discharge Instructions (Addendum)
Please take tramadol as needed for pain. Call and follow up with your GI specialist for outpatient MRCP.  Return if your condition worsen or if you have other concerns.

## 2019-01-19 ENCOUNTER — Other Ambulatory Visit: Payer: Self-pay

## 2019-01-19 DIAGNOSIS — R1013 Epigastric pain: Secondary | ICD-10-CM

## 2019-01-19 MED ORDER — ESOMEPRAZOLE MAGNESIUM 40 MG PO CPDR: 40.0000 mg | DELAYED_RELEASE_CAPSULE | Freq: Two times a day (BID) | ORAL | 0 refills | Status: DC

## 2019-01-19 MED FILL — ESOMEPRAZOLE MAG DR 40 MG C: 40 | 14 days supply | Qty: 28 | Fill #0

## 2019-01-19 MED FILL — AMLODIPINE BESYLATE 5 MG TA: 5 | 90 days supply | Qty: 90 | Fill #0

## 2019-01-31 ENCOUNTER — Telehealth: Payer: Self-pay

## 2019-01-31 NOTE — Telephone Encounter (Signed)
Covid-19 screening questions   Do you now or have you had a fever in the last 14 days?  Do you have any respiratory symptoms of shortness of breath or cough now or in the last 14 days?  Do you have any family members or close contacts with diagnosed or suspected Covid-19 in the past 14 days?  Have you been tested for Covid-19 and found to be positive?  Called and left voicemail to call back to answer these question

## 2019-02-01 ENCOUNTER — Ambulatory Visit (INDEPENDENT_AMBULATORY_CARE_PROVIDER_SITE_OTHER): Payer: No Typology Code available for payment source | Admitting: Nurse Practitioner

## 2019-02-01 ENCOUNTER — Encounter: Payer: Self-pay | Admitting: Nurse Practitioner

## 2019-02-01 ENCOUNTER — Other Ambulatory Visit: Payer: Self-pay

## 2019-02-01 VITALS — BP 138/80 | HR 69 | Temp 98.4°F | Ht 65.0 in | Wt 290.1 lb

## 2019-02-01 DIAGNOSIS — R9389 Abnormal findings on diagnostic imaging of other specified body structures: Secondary | ICD-10-CM | POA: Diagnosis not present

## 2019-02-01 DIAGNOSIS — R1013 Epigastric pain: Secondary | ICD-10-CM | POA: Diagnosis not present

## 2019-02-01 DIAGNOSIS — D509 Iron deficiency anemia, unspecified: Secondary | ICD-10-CM

## 2019-02-01 NOTE — H&P (View-Only) (Signed)
ASSESSMENT / PLAN:   38 yo female with morbid obesity, HTN, hx of iron deficiency anemia. Referred by PCP Dr. Renold Genta for abdominal pain and abnormal CT scan   1. Recent development of severe episodic epigastric pain radiating through to back with associated N/V in setting of CTAP w/ contrast revealing diilated main pancreatic duct (4 mm) with abrupt transition at the neck without evidence for stone or mass. No history of chronic pancreatitis. Of note, CTAP didn't comment on CBD but liver tests are normal.  -Will ask Dr. Loletha Carrow to review images, she may need MRCP.  -abdominal pain doesn't sound peptic in nature but will not stop acid blockers and Carafate altogether until more is known.  For now she will continue the daily Nexium.  I think we can omit famotidine for now.   -Decrease Carafate from 4 times daily to twice daily as it can be constipating and she has already had to purge her bowels once after receiving pain meds in ED   2. Mild microcytic anemia, mild. Hgb 11.9.  She has a hx of IDA felt secondary to menorrhagia. PCP Dr. Renold Genta has managed with iron supplements. , treated with . She Ferritin 20 in January of this year. No overt GI bleeding  3. Steatosis (dense) on CT scan. She is morbidly obesity. PCP has talked with her about referral to Bariatric Surgery.    HPI:    Chief Complaint:   Abdominal pain  Patient is a 38 year old female with a history of kidney stones, obesity, cholecystectomy, breast reduction ,  and C-section x3.  She was seen in the ED late June for evaluation of acute epigastric pain radiating through to her back with associated N/V.  The pain felt like terrible contractions. CTAP w/ contrast scan demonstrated hepatic steatosis and a dilated main pancreatic duct of 4 mm with abrupt transition at the neck.  Labs including CBC, CMET, lipase were unremarkable.  Patient got morphine in the ED, pain resolved and she was discharged home.  Prior to  discharge the ED spoke with Dr. Michail Sermon of [facility] who recommended outpatient MRCP but this didn't get arranged before she had another acute episode of pain two days later sending her back to the ED. pain resolved, she was discharged home.  Somewhere between the PCP and ED visit patient was started on Nexium, Pepcid and Carafate   Data Reviewed:   ED 01/16/19 Liver test normal, lipase normal WBC normal, hemoglobin 11.9, MCV 76.8, platelets 343  CTAP w/ contrast 01/16/19 IMPRESSION: 1. 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 No underlying mass is seen, but recommend MRCP. 2. Marked hepatic steatosis   Past Medical History:  Diagnosis Date   History of kidney stones    Hx of migraine headaches    Hx of migraines      Past Surgical History:  Procedure Laterality Date   BREAST REDUCTION SURGERY Bilateral 06/24/2017   Procedure: MAMMARY REDUCTION  (BREAST);  Surgeon: Crissie Reese, MD;  Location: [place];  Service: Plastics;  Laterality: Bilateral;   BREAST SURGERY Bilateral 05/2017   CESAREAN SECTION  03, 05, 06   x's 3   CHOLECYSTECTOMY  2007   TUBAL LIGATION  2006   Family History  Problem Relation Age of Onset   Healthy Mother    Healthy Father    Gout Father    Healthy Maternal Grandmother  Healthy Maternal Grandfather    Healthy Paternal Grandmother    Healthy Paternal Grandfather    Colon cancer Neg Hx    Esophageal cancer Neg Hx    Social History   Tobacco Use   Smoking status: Light Tobacco Smoker    Packs/day: 0.10    Years: 5.00    Pack years: 0.50    Types: Cigarettes   Smokeless tobacco: Never Used   Tobacco comment: 2 cigars a week  Substance Use Topics   Alcohol use: Yes    Comment: occasional   Drug use: No   Current Outpatient Medications  Medication Sig Dispense Refill   amLODipine (NORVASC) 5 MG tablet Take 1 tablet (5 mg total) by mouth daily. 90 tablet 1   esomeprazole (NEXIUM) 40 MG  capsule Take 1 capsule (40 mg total) by mouth 2 (two) times daily before a meal. 28 capsule 0   famotidine (PEPCID) 20 MG tablet Take 1 tablet (20 mg total) by mouth 2 (two) times daily. 30 tablet 0   sucralfate (CARAFATE) 1 g tablet Take 1 tablet (1 g total) by mouth 4 (four) times daily -  with meals and at bedtime. 10 tablet 0   traMADol (ULTRAM) 50 MG tablet Take 1 tablet (50 mg total) by mouth every 6 (six) hours as needed for moderate pain. 10 tablet 0   ondansetron (ZOFRAN ODT) 4 MG disintegrating tablet Take 1 tablet (4 mg total) by mouth every 8 (eight) hours as needed for nausea or vomiting. (Patient not taking: Reported on 02/01/2019) 5 tablet 0   No current facility-administered medications for this visit.    No Known Allergies   Review of Systems: All systems reviewed and negative except where noted in HPI.   Serum creatinine: 0.9 mg/dL 01/18/19 1249 Estimated creatinine clearance: 118.4 mL/min   Physical Exam:    Wt Readings from Last 3 Encounters:  02/01/19 290 lb 2 oz (131.6 kg)  01/16/19 290 lb (131.5 kg)  08/12/18 280 lb (127 kg)    BP 138/80    Pulse 69    Temp 98.4 F (36.9 C)    Ht 5\' 5"  (1.651 m)    Wt 290 lb 2 oz (131.6 kg)    BMI 48.28 kg/m  Constitutional:  Pleasant female in no acute distress. Psychiatric: Normal mood and affect. Behavior is normal. EENT: Pupils normal.  Conjunctivae are normal. No scleral icterus. Neck supple.  Cardiovascular: Normal rate, regular rhythm. No edema Pulmonary/chest: Effort normal and breath sounds normal. No wheezing, rales or rhonchi. Abdominal: Soft, nondistended, nontender. Bowel sounds active throughout. There are no masses palpable. No hepatomegaly. Neurological: Alert and oriented to person place and time. Skin: Skin is warm and dry. No rashes noted.  Tye Savoy, NP  02/01/2019, 3:18 PM   Baxley, Cresenciano Lick, MD

## 2019-02-01 NOTE — Patient Instructions (Signed)
If you are age 36 or older, your body mass index should be between 23-30. Your Body mass index is 48.28 kg/m. If this is out of the aforementioned range listed, please consider follow up with your Primary Care Provider.  If you are age 74 or younger, your body mass index should be between 19-25. Your Body mass index is 48.28 kg/m. If this is out of the aformentioned range listed, please consider follow up with your Primary Care Provider.   Continue Nexium.  STOP Pepcid.  DECREASE Carafate to twice daily before meals.  We will call you after I speak with Dr. Loletha Carrow.  Thank you for choosing me and [facility].   Tye Savoy, NP

## 2019-02-01 NOTE — Progress Notes (Signed)
ASSESSMENT / PLAN:   38 yo female with morbid obesity, HTN, hx of iron deficiency anemia. Referred by PCP Dr. Renold Genta for abdominal pain and abnormal CT scan   1. Recent development of severe episodic epigastric pain radiating through to back with associated N/V in setting of CTAP w/ contrast revealing diilated main pancreatic duct (4 mm) with abrupt transition at the neck without evidence for stone or mass. No history of chronic pancreatitis. Of note, CTAP didn't comment on CBD but liver tests are normal.  -Will ask Dr. Loletha Carrow to review images, she may need MRCP.  -abdominal pain doesn't sound peptic in nature but will not stop acid blockers and Carafate altogether until more is known.  For now she will continue the daily Nexium.  I think we can omit famotidine for now.   -Decrease Carafate from 4 times daily to twice daily as it can be constipating and she has already had to purge her bowels once after receiving pain meds in ED   2. Mild microcytic anemia, mild. Hgb 11.9.  She has a hx of IDA felt secondary to menorrhagia. PCP Dr. Renold Genta has managed with iron supplements. , treated with . She Ferritin 20 in January of this year. No overt GI bleeding  3. Steatosis (dense) on CT scan. She is morbidly obesity. PCP has talked with her about referral to Bariatric Surgery.    HPI:    Chief Complaint:   Abdominal pain  Patient is a 38 year old female with a history of kidney stones, obesity, cholecystectomy, breast reduction ,  and C-section x3.  She was seen in the ED late June for evaluation of acute epigastric pain radiating through to her back with associated N/V.  The pain felt like terrible contractions. CTAP w/ contrast scan demonstrated hepatic steatosis and a dilated main pancreatic duct of 4 mm with abrupt transition at the neck.  Labs including CBC, CMET, lipase were unremarkable.  Patient got morphine in the ED, pain resolved and she was discharged home.  Prior to  discharge the ED spoke with Dr. Michail Sermon of [facility] who recommended outpatient MRCP but this didn't get arranged before she had another acute episode of pain two days later sending her back to the ED. pain resolved, she was discharged home.  Somewhere between the PCP and ED visit patient was started on Nexium, Pepcid and Carafate   Data Reviewed:   ED 01/16/19 Liver test normal, lipase normal WBC normal, hemoglobin 11.9, MCV 76.8, platelets 343  CTAP w/ contrast 01/16/19 IMPRESSION: 1. 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 No underlying mass is seen, but recommend MRCP. 2. Marked hepatic steatosis   Past Medical History:  Diagnosis Date   History of kidney stones    Hx of migraine headaches    Hx of migraines      Past Surgical History:  Procedure Laterality Date   BREAST REDUCTION SURGERY Bilateral 06/24/2017   Procedure: MAMMARY REDUCTION  (BREAST);  Surgeon: Crissie Reese, MD;  Location: [place];  Service: Plastics;  Laterality: Bilateral;   BREAST SURGERY Bilateral 05/2017   CESAREAN SECTION  03, 05, 06   x's 3   CHOLECYSTECTOMY  2007   TUBAL LIGATION  2006   Family History  Problem Relation Age of Onset   Healthy Mother    Healthy Father    Gout Father    Healthy Maternal Grandmother  Healthy Maternal Grandfather    Healthy Paternal Grandmother    Healthy Paternal Grandfather    Colon cancer Neg Hx    Esophageal cancer Neg Hx    Social History   Tobacco Use   Smoking status: Light Tobacco Smoker    Packs/day: 0.10    Years: 5.00    Pack years: 0.50    Types: Cigarettes   Smokeless tobacco: Never Used   Tobacco comment: 2 cigars a week  Substance Use Topics   Alcohol use: Yes    Comment: occasional   Drug use: No   Current Outpatient Medications  Medication Sig Dispense Refill   amLODipine (NORVASC) 5 MG tablet Take 1 tablet (5 mg total) by mouth daily. 90 tablet 1   esomeprazole (NEXIUM) 40 MG  capsule Take 1 capsule (40 mg total) by mouth 2 (two) times daily before a meal. 28 capsule 0   famotidine (PEPCID) 20 MG tablet Take 1 tablet (20 mg total) by mouth 2 (two) times daily. 30 tablet 0   sucralfate (CARAFATE) 1 g tablet Take 1 tablet (1 g total) by mouth 4 (four) times daily -  with meals and at bedtime. 10 tablet 0   traMADol (ULTRAM) 50 MG tablet Take 1 tablet (50 mg total) by mouth every 6 (six) hours as needed for moderate pain. 10 tablet 0   ondansetron (ZOFRAN ODT) 4 MG disintegrating tablet Take 1 tablet (4 mg total) by mouth every 8 (eight) hours as needed for nausea or vomiting. (Patient not taking: Reported on 02/01/2019) 5 tablet 0   No current facility-administered medications for this visit.    No Known Allergies   Review of Systems: All systems reviewed and negative except where noted in HPI.   Serum creatinine: 0.9 mg/dL 01/18/19 1249 Estimated creatinine clearance: 118.4 mL/min   Physical Exam:    Wt Readings from Last 3 Encounters:  02/01/19 290 lb 2 oz (131.6 kg)  01/16/19 290 lb (131.5 kg)  08/12/18 280 lb (127 kg)    BP 138/80    Pulse 69    Temp 98.4 F (36.9 C)    Ht 5\' 5"  (1.651 m)    Wt 290 lb 2 oz (131.6 kg)    BMI 48.28 kg/m  Constitutional:  Pleasant female in no acute distress. Psychiatric: Normal mood and affect. Behavior is normal. EENT: Pupils normal.  Conjunctivae are normal. No scleral icterus. Neck supple.  Cardiovascular: Normal rate, regular rhythm. No edema Pulmonary/chest: Effort normal and breath sounds normal. No wheezing, rales or rhonchi. Abdominal: Soft, nondistended, nontender. Bowel sounds active throughout. There are no masses palpable. No hepatomegaly. Neurological: Alert and oriented to person place and time. Skin: Skin is warm and dry. No rashes noted.  Tye Savoy, NP  02/01/2019, 3:18 PM   Baxley, Cresenciano Lick, MD

## 2019-02-01 NOTE — Telephone Encounter (Signed)
Covid-19 screening questions   Do you now or have you had a fever in the last 14 days? No  Do you have any respiratory symptoms of shortness of breath or cough now or in the last 14 days? No  Do you have any family members or close contacts with diagnosed or suspected Covid-19 in the past 14 days? No  Have you been tested for Covid-19 and found to be positive? No Answered 02/01/2019

## 2019-02-02 ENCOUNTER — Encounter: Payer: Self-pay | Admitting: Nurse Practitioner

## 2019-02-03 NOTE — Progress Notes (Signed)
____________________________________________________________  Attending physician addendum:  Thank you for sending this case to me. I have reviewed the entire note, and the CT scan images.  The pancreatic duct does appear dilated on the CT scan.  Radiologist did not make particular mention of the biliary tree, but I do not see any apparent biliary ductal dilatation, and I agree that normal LFTs along with that seem to speak against retained common bile duct stone.  It is not clear how or if this imaging finding is related to the abdominal pain, or whether the pain will continue.  I agree it does not sound likely to be peptic in nature.  I recommended discontinuation of acid suppression and Carafate, and obtaining MRCP on the radiologist's suggestion.  Wilfrid Lund, MD  ____________________________________________________________

## 2019-02-06 ENCOUNTER — Telehealth: Payer: Self-pay

## 2019-02-06 ENCOUNTER — Other Ambulatory Visit: Payer: Self-pay

## 2019-02-06 DIAGNOSIS — R9389 Abnormal findings on diagnostic imaging of other specified body structures: Secondary | ICD-10-CM

## 2019-02-06 DIAGNOSIS — R1010 Upper abdominal pain, unspecified: Secondary | ICD-10-CM

## 2019-02-06 NOTE — Telephone Encounter (Signed)
Left message on patients voicemail regarding date and time of MRCP at [facility].  Requested that patient return call to confirm date and time.  Mailed instructions to patient.

## 2019-02-07 DIAGNOSIS — F419 Anxiety disorder, unspecified: Secondary | ICD-10-CM

## 2019-02-07 MED ORDER — DIAZEPAM 10 MG PO TABS: ORAL_TABLET | ORAL | 1 refills | Status: DC

## 2019-02-07 MED FILL — DIAZEPAM 10 MG TABS: 10 | 1 days supply | Qty: 1 | Fill #0

## 2019-02-13 ENCOUNTER — Ambulatory Visit (HOSPITAL_COMMUNITY)
Admission: RE | Admit: 2019-02-13 | Discharge: 2019-02-13 | Disposition: A | Discharge status: Home / Self Care | Payer: No Typology Code available for payment source | Source: Ambulatory Visit | Attending: Nurse Practitioner | Admitting: Nurse Practitioner

## 2019-02-13 ENCOUNTER — Other Ambulatory Visit: Payer: Self-pay

## 2019-02-13 ENCOUNTER — Other Ambulatory Visit: Payer: Self-pay | Admitting: Nurse Practitioner

## 2019-02-13 DIAGNOSIS — R9389 Abnormal findings on diagnostic imaging of other specified body structures: Secondary | ICD-10-CM | POA: Diagnosis not present

## 2019-02-13 DIAGNOSIS — R1010 Upper abdominal pain, unspecified: Secondary | ICD-10-CM | POA: Diagnosis present

## 2019-02-13 MED ORDER — GADOBUTROL 1 MMOL/ML IV SOLN
10.0000 mL | Freq: Once | INTRAVENOUS | Status: AC | PRN
  Administered 2019-02-13: 10 mL via INTRAVENOUS

## 2019-02-15 ENCOUNTER — Other Ambulatory Visit: Payer: Self-pay

## 2019-02-15 ENCOUNTER — Ambulatory Visit: Payer: No Typology Code available for payment source | Admitting: Gastroenterology

## 2019-02-15 DIAGNOSIS — R9389 Abnormal findings on diagnostic imaging of other specified body structures: Secondary | ICD-10-CM

## 2019-02-15 DIAGNOSIS — K8689 Other specified diseases of pancreas: Secondary | ICD-10-CM

## 2019-02-15 DIAGNOSIS — R1010 Upper abdominal pain, unspecified: Secondary | ICD-10-CM

## 2019-02-15 DIAGNOSIS — R948 Abnormal results of function studies of other organs and systems: Secondary | ICD-10-CM

## 2019-02-27 ENCOUNTER — Encounter (HOSPITAL_COMMUNITY): Payer: Self-pay | Admitting: *Deleted

## 2019-02-27 ENCOUNTER — Other Ambulatory Visit (HOSPITAL_COMMUNITY)
Admission: RE | Admit: 2019-02-27 | Discharge: 2019-02-27 | Disposition: A | Discharge status: Home / Self Care | Payer: No Typology Code available for payment source | Source: Ambulatory Visit | Attending: Gastroenterology | Admitting: Gastroenterology

## 2019-02-27 DIAGNOSIS — Z01812 Encounter for preprocedural laboratory examination: Secondary | ICD-10-CM | POA: Insufficient documentation

## 2019-02-27 DIAGNOSIS — Z20828 Contact with and (suspected) exposure to other viral communicable diseases: Secondary | ICD-10-CM | POA: Diagnosis not present

## 2019-02-27 LAB — SARS CORONAVIRUS 2 (TAT 6-24 HRS): SARS Coronavirus 2: NEGATIVE

## 2019-03-01 NOTE — Progress Notes (Signed)
Attempted to contact patient regarding 03/02/2019 procedure. No answer

## 2019-03-02 ENCOUNTER — Ambulatory Visit (HOSPITAL_COMMUNITY)
Admission: RE | Admit: 2019-03-02 | Discharge: 2019-03-02 | Disposition: A | Discharge status: Home / Self Care | Payer: No Typology Code available for payment source | Attending: Gastroenterology | Admitting: Gastroenterology

## 2019-03-02 ENCOUNTER — Encounter (HOSPITAL_COMMUNITY): Payer: Self-pay | Admitting: *Deleted

## 2019-03-02 ENCOUNTER — Ambulatory Visit (HOSPITAL_COMMUNITY): Payer: No Typology Code available for payment source | Admitting: Anesthesiology

## 2019-03-02 ENCOUNTER — Encounter (HOSPITAL_COMMUNITY)
Admission: RE | Disposition: A | Discharge status: Home / Self Care | Payer: Self-pay | Source: Home / Self Care | Attending: Gastroenterology

## 2019-03-02 ENCOUNTER — Other Ambulatory Visit: Payer: Self-pay

## 2019-03-02 DIAGNOSIS — R948 Abnormal results of function studies of other organs and systems: Secondary | ICD-10-CM | POA: Diagnosis not present

## 2019-03-02 DIAGNOSIS — Z6841 Body Mass Index (BMI) 40.0 and over, adult: Secondary | ICD-10-CM | POA: Insufficient documentation

## 2019-03-02 DIAGNOSIS — F17210 Nicotine dependence, cigarettes, uncomplicated: Secondary | ICD-10-CM | POA: Diagnosis not present

## 2019-03-02 DIAGNOSIS — R1010 Upper abdominal pain, unspecified: Secondary | ICD-10-CM

## 2019-03-02 DIAGNOSIS — R9389 Abnormal findings on diagnostic imaging of other specified body structures: Secondary | ICD-10-CM | POA: Diagnosis not present

## 2019-03-02 DIAGNOSIS — K8689 Other specified diseases of pancreas: Secondary | ICD-10-CM

## 2019-03-02 DIAGNOSIS — Z79899 Other long term (current) drug therapy: Secondary | ICD-10-CM | POA: Insufficient documentation

## 2019-03-02 DIAGNOSIS — I10 Essential (primary) hypertension: Secondary | ICD-10-CM | POA: Insufficient documentation

## 2019-03-02 DIAGNOSIS — D509 Iron deficiency anemia, unspecified: Secondary | ICD-10-CM | POA: Diagnosis not present

## 2019-03-02 DIAGNOSIS — Z87442 Personal history of urinary calculi: Secondary | ICD-10-CM | POA: Insufficient documentation

## 2019-03-02 HISTORY — PX: FINE NEEDLE ASPIRATION: SHX5430

## 2019-03-02 HISTORY — PX: ESOPHAGOGASTRODUODENOSCOPY: SHX5428

## 2019-03-02 HISTORY — PX: EUS: SHX5427

## 2019-03-02 LAB — PREGNANCY, URINE: Preg Test, Ur: NEGATIVE

## 2019-03-02 SURGERY — EGD (ESOPHAGOGASTRODUODENOSCOPY)
Anesthesia: Monitor Anesthesia Care

## 2019-03-02 MED ORDER — LIDOCAINE 2% (20 MG/ML) 5 ML SYRINGE
INTRAMUSCULAR | Status: DC | PRN
  Administered 2019-03-02: 100 mg via INTRAVENOUS

## 2019-03-02 MED ORDER — ONDANSETRON HCL 4 MG/2ML IJ SOLN
INTRAMUSCULAR | Status: DC | PRN
  Administered 2019-03-02: 4 mg via INTRAVENOUS

## 2019-03-02 MED ORDER — PROPOFOL 10 MG/ML IV BOLUS: INTRAVENOUS | Status: DC | PRN

## 2019-03-02 MED ORDER — PROPOFOL 10 MG/ML IV BOLUS
INTRAVENOUS | Status: AC
  Filled 2019-03-02: qty 20

## 2019-03-02 MED ORDER — MIDAZOLAM HCL 5 MG/5ML IJ SOLN
INTRAMUSCULAR | Status: DC | PRN
  Administered 2019-03-02: 2 mg via INTRAVENOUS

## 2019-03-02 MED ORDER — GLYCOPYRROLATE 0.2 MG/ML IJ SOLN
INTRAMUSCULAR | Status: DC | PRN
  Administered 2019-03-02: 0.1 mg via INTRAVENOUS

## 2019-03-02 MED ORDER — PROPOFOL 10 MG/ML IV BOLUS
INTRAVENOUS | Status: DC | PRN
  Administered 2019-03-02: 30 mg via INTRAVENOUS
  Administered 2019-03-02 (×2): 10 mg via INTRAVENOUS

## 2019-03-02 MED ORDER — MIDAZOLAM HCL 2 MG/2ML IJ SOLN
INTRAMUSCULAR | Status: AC
  Filled 2019-03-02: qty 2

## 2019-03-02 MED ORDER — LACTATED RINGERS IV SOLN
INTRAVENOUS | Status: DC
  Administered 2019-03-02: 1000 mL via INTRAVENOUS

## 2019-03-02 MED ORDER — PROPOFOL 500 MG/50ML IV EMUL
INTRAVENOUS | Status: DC | PRN
  Administered 2019-03-02: 125 ug/kg/min via INTRAVENOUS

## 2019-03-02 MED ORDER — SODIUM CHLORIDE 0.9 % IV SOLN: INTRAVENOUS | Status: DC

## 2019-03-02 MED ORDER — PROPOFOL 10 MG/ML IV BOLUS
INTRAVENOUS | Status: AC
  Filled 2019-03-02: qty 40

## 2019-03-02 NOTE — Anesthesia Preprocedure Evaluation (Addendum)
Anesthesia Evaluation  Patient identified by MRN, date of birth, ID band Patient awake    Reviewed: Allergy & Precautions, NPO status , Patient's Chart, lab work & pertinent test results  History of Anesthesia Complications Negative for: history of anesthetic complications  Airway Mallampati: III  TM Distance: >3 FB Neck ROM: Full    Dental no notable dental hx. (+) Dental Advisory Given   Pulmonary neg pulmonary ROS, Current Smoker and Patient abstained from smoking.,    Pulmonary exam normal        Cardiovascular hypertension, Pt. on medications Normal cardiovascular exam     Neuro/Psych negative neurological ROS     GI/Hepatic Neg liver ROS, GERD  Medicated,  Endo/Other  Morbid obesity  Renal/GU negative Renal ROS     Musculoskeletal negative musculoskeletal ROS (+)   Abdominal   Peds  Hematology negative hematology ROS (+)   Anesthesia Other Findings Day of surgery medications reviewed with the patient.  Reproductive/Obstetrics                            Anesthesia Physical Anesthesia Plan  ASA: III  Anesthesia Plan: MAC   Post-op Pain Management:    Induction:   PONV Risk Score and Plan: 2 and Ondansetron and Propofol infusion  Airway Management Planned: Natural Airway and Simple Face Mask  Additional Equipment:   Intra-op Plan:   Post-operative Plan:   Informed Consent: I have reviewed the patients History and Physical, chart, labs and discussed the procedure including the risks, benefits and alternatives for the proposed anesthesia with the patient or authorized representative who has indicated his/her understanding and acceptance.     Dental advisory given  Plan Discussed with: CRNA and Anesthesiologist  Anesthesia Plan Comments:        Anesthesia Quick Evaluation

## 2019-03-02 NOTE — Anesthesia Postprocedure Evaluation (Signed)
Anesthesia Post Note  Patient: Dawn Young  Procedure(s) Performed: ESOPHAGOGASTRODUODENOSCOPY (EGD) (N/A ) UPPER ENDOSCOPIC ULTRASOUND (EUS) RADIAL (N/A ) FINE NEEDLE ASPIRATION (FNA) LINEAR (N/A )     Patient location during evaluation: PACU Anesthesia Type: MAC Level of consciousness: awake and alert Pain management: pain level controlled Vital Signs Assessment: post-procedure vital signs reviewed and stable Respiratory status: spontaneous breathing and respiratory function stable Cardiovascular status: stable Postop Assessment: no apparent nausea or vomiting Anesthetic complications: no    Last Vitals:  Vitals:   03/02/19 1210 03/02/19 1220  BP: (!) 149/82 (!) 147/94  Pulse: 75 88  Resp: 19 19  Temp:    SpO2: 100% 96%    Last Pain:  Vitals:   03/02/19 1220  TempSrc:   PainSc: 0-No pain                 Anastacia Reinecke DANIEL

## 2019-03-02 NOTE — Anesthesia Procedure Notes (Signed)
Procedure Name: MAC Date/Time: 03/02/2019 10:48 AM Performed by: Maxwell Caul, CRNA Pre-anesthesia Checklist: Patient identified, Emergency Drugs available, Suction available, Patient being monitored and Timeout performed Oxygen Delivery Method: Nasal cannula

## 2019-03-02 NOTE — Transfer of Care (Signed)
Immediate Anesthesia Transfer of Care Note  Patient: Pollyann Glen  Procedure(s) Performed: ESOPHAGOGASTRODUODENOSCOPY (EGD) (N/A ) UPPER ENDOSCOPIC ULTRASOUND (EUS) RADIAL (N/A ) FINE NEEDLE ASPIRATION (FNA) LINEAR (N/A )  Patient Location: PACU and Endoscopy Unit  Anesthesia Type:MAC  Level of Consciousness: awake, alert , oriented and patient cooperative  Airway & Oxygen Therapy: Patient Spontanous Breathing and Patient connected to nasal cannula oxygen  Post-op Assessment: Report given to RN and Post -op Vital signs reviewed and stable  Post vital signs: Reviewed and stable  Last Vitals:  Vitals Value Taken Time  BP 132/84 03/02/19 1156  Temp    Pulse 75 03/02/19 1159  Resp 21 03/02/19 1159  SpO2 100 % 03/02/19 1159  Vitals shown include unvalidated device data.  Last Pain:  Vitals:   03/02/19 1156  TempSrc:   PainSc: 0-No pain         Complications: No apparent anesthesia complications

## 2019-03-02 NOTE — Discharge Instructions (Signed)
YOU HAD AN ENDOSCOPIC PROCEDURE TODAY: Refer to the procedure report and other information in the discharge instructions given to you for any specific questions about what was found during the examination. If this information does not answer your questions, please call [place] office at [phone] to clarify.   YOU SHOULD EXPECT: Some feelings of bloating in the abdomen. Passage of more gas than usual. Walking can help get rid of the air that was put into your GI tract during the procedure and reduce the bloating. If you had a lower endoscopy (such as a colonoscopy or flexible sigmoidoscopy) you may notice spotting of blood in your stool or on the toilet paper. Some abdominal soreness may be present for a day or two, also.  DIET: Your first meal following the procedure should be a light meal and then it is ok to progress to your normal diet. A half-sandwich or bowl of soup is an example of a good first meal. Heavy or fried foods are harder to digest and may make you feel nauseous or bloated. Drink plenty of fluids but you should avoid alcoholic beverages for 24 hours. If you had a esophageal dilation, please see attached instructions for diet.    ACTIVITY: Your care partner should take you home directly after the procedure. You should plan to take it easy, moving slowly for the rest of the day. You can resume normal activity the day after the procedure however YOU SHOULD NOT DRIVE, use power tools, machinery or perform tasks that involve climbing or major physical exertion for 24 hours (because of the sedation medicines used during the test).   SYMPTOMS TO REPORT IMMEDIATELY: A gastroenterologist can be reached at any hour. Please call [phone]  for any of the following symptoms:   Following upper endoscopy (EGD, EUS, ERCP, esophageal dilation) Vomiting of blood or coffee ground material  New, significant abdominal pain  New, significant chest pain or pain under the shoulder blades  Painful or  persistently difficult swallowing  New shortness of breath  Black, tarry-looking or red, bloody stools  FOLLOW UP:  If any biopsies were taken you will be contacted by phone or by letter within the next 1-3 weeks. Call [phone]  if you have not heard about the biopsies in 3 weeks.  Please also call with any specific questions about appointments or follow up tests.  

## 2019-03-02 NOTE — Op Note (Signed)
[facility] Patient Name: Dawn Young Procedure Date: 03/02/2019 MRN: [id number] Attending MD: Milus Banister , MD Date of Birth: Oct 16, 1980 CSN: [id number] Age: 38 Admit Type: Outpatient Procedure:                Upper EUS Indications:              intermittent abd pains led to imaging (CT and MR)                            which show dilated main pancreatic duct with                            transition in neck of pancreas to normal duct. NO                            obvious mass on CT or MR. Gaining weight. Never                            pancreatic disease in the past. LFTs and pancreatic                            enzymes have been normal. Providers:                Milus Banister, MD, Cleda Daub, RN, Ladona Ridgel, Technician Referring MD:             Wilfrid Lund, MD Medicines:                Monitored Anesthesia Care Complications:            No immediate complications. Estimated blood loss:                            None. Estimated Blood Loss:     Estimated blood loss: none. Procedure:                Pre-Anesthesia Assessment:                           - Prior to the procedure, a History and Physical                            was performed, and patient medications and                            allergies were reviewed. The patient's tolerance of                            previous anesthesia was also reviewed. The risks                            and benefits of the procedure and the sedation  options and risks were discussed with the patient.                            All questions were answered, and informed consent                            was obtained. Prior Anticoagulants: The patient has                            taken no previous anticoagulant or antiplatelet                            agents. ASA Grade Assessment: IV - A patient with                            severe systemic  disease that is a constant threat                            to life. After reviewing the risks and benefits,                            the patient was deemed in satisfactory condition to                            undergo the procedure.                           After obtaining informed consent, the endoscope was                            passed under direct vision. Throughout the                            procedure, the patient's blood pressure, pulse, and                            oxygen saturations were monitored continuously. The                            [id number]-[id number] ([id number]) Olympus Radial EUS was                            introduced through the mouth, and advanced to the                            second part of duodenum. The [id number] ([id number])                            Olympus Linear EUS was introduced through the                            mouth, and advanced to the second part of duodenum.  The upper EUS was accomplished without difficulty.                            The patient tolerated the procedure well. Scope In: Scope Out: Findings:      ENDOSCOPIC FINDING: :      The examined esophagus was endoscopically normal.      The entire examined stomach was endoscopically normal.      The examined duodenum was endoscopically normal.      ENDOSONOGRAPHIC FINDING: :      1. The main pancreatic duct was abnormal. It was dilated in the body and       tail of the pancreas with ectatic and dilated side side branches. The       duct transitioned to normal in the neck of the pancreas and the       parechyma at that site was very slightly irregular (more hypoechoic than       usual) without any obvious masses. I sampled the slightly irregular       parenchyma with two transgastric passes with a 25 guage EUS FNA needle.      2. CBD was normal, non-dilated      3. Gallbladder was surgically absent.      4. No peripancreatic adenopathy.      5. Limited  view of the liver were all normal. Impression:               - Slightly abnormal pancreatic parenchyma in the                            neck of the pancreas at the site of abrupt main                            pancreatic duct caliber transition. No clear masses                            however preliminary cytology review from the site                            shows neoplastic cells, await final patholgy. Moderate Sedation:      Not Applicable - Patient had care per Anesthesia. Recommendation:           - Discharge patient to home (ambulatory).                           - Await final cytology results. Procedure Code(s):        --- Professional ---                           [phone], Esophagogastroduodenoscopy, flexible,                            transoral; with transendoscopic ultrasound-guided                            intramural or transmural fine needle                            aspiration/biopsy(s), (  includes endoscopic                            ultrasound examination limited to the esophagus,                            stomach or duodenum, and adjacent structures) Diagnosis Code(s):        --- Professional ---                           K86.89, Other specified diseases of pancreas                           K92.9, Disease of digestive system, unspecified                           R93.89, Abnormal findings on diagnostic imaging of                            other specified body structures CPT copyright 2019 American Medical Association. All rights reserved. The codes documented in this report are preliminary and upon coder review may  be revised to meet current compliance requirements. Milus Banister, MD 03/02/2019 12:16:45 PM This report has been signed electronically. Number of Addenda: 0

## 2019-03-02 NOTE — Interval H&P Note (Signed)
History and Physical Interval Note:  03/02/2019 10:17 AM  Dawn Young  has presented today for surgery, with the diagnosis of Pancreatic duct dilation abnormal pancreas.  The various methods of treatment have been discussed with the patient and family. After consideration of risks, benefits and other options for treatment, the patient has consented to  Procedure(s): ESOPHAGOGASTRODUODENOSCOPY (EGD) (N/A) UPPER ENDOSCOPIC ULTRASOUND (EUS) RADIAL (N/A) as a surgical intervention.  The patient's history has been reviewed, patient examined, no change in status, stable for surgery.  I have reviewed the patient's chart and labs.  Questions were answered to the patient's satisfaction.     Milus Banister

## 2019-03-05 ENCOUNTER — Encounter (HOSPITAL_COMMUNITY): Payer: Self-pay | Admitting: Gastroenterology

## 2019-03-06 ENCOUNTER — Other Ambulatory Visit (HOSPITAL_COMMUNITY): Payer: No Typology Code available for payment source

## 2019-03-20 ENCOUNTER — Other Ambulatory Visit: Payer: Self-pay | Admitting: General Surgery

## 2019-05-29 ENCOUNTER — Other Ambulatory Visit: Payer: Self-pay | Admitting: General Surgery

## 2019-10-10 ENCOUNTER — Telehealth: Payer: Self-pay | Admitting: Internal Medicine

## 2019-10-10 NOTE — Telephone Encounter (Signed)
LVM for patient to call back and schedule appointment for paperwork to be completed. However they are needing 5 years of weight loss attempts, weight, BMI. Patient was first seen 10/2017 CPE and new patient appointment, 01/2018 2 month recheck, 08/05/18 syncopal episode and 08/12/18 syncopal episode follow up, none of these visits were for weight loss.

## 2019-10-12 ENCOUNTER — Other Ambulatory Visit: Payer: No Typology Code available for payment source | Admitting: Internal Medicine

## 2019-10-13 ENCOUNTER — Encounter: Payer: No Typology Code available for payment source | Admitting: Internal Medicine

## 2019-10-13 NOTE — Telephone Encounter (Signed)
Appointment scheduled.

## 2019-10-21 ENCOUNTER — Telehealth: Payer: No Typology Code available for payment source | Admitting: Emergency Medicine

## 2019-10-21 DIAGNOSIS — N941 Unspecified dyspareunia: Secondary | ICD-10-CM

## 2019-10-21 DIAGNOSIS — N898 Other specified noninflammatory disorders of vagina: Secondary | ICD-10-CM

## 2019-10-21 NOTE — Progress Notes (Signed)
Based on what you shared with me, I feel your condition warrants further evaluation and I recommend that you be seen for a face to face office visit.  Your symptoms sound consistent with Cervicitis.  This will require thorough pelvic exam with collection of samples of discharge and evaluation of the cervix along with a urinalysis to properly diagnose where the infection is coming from.    Please follow-up at one of the urgent cares listed to be evaluated.     NOTE: If you entered your credit card information for this eVisit, you will not be charged. You may see a "hold" on your card for the $35 but that hold will drop off and you will not have a charge processed.   If you are having a true medical emergency please call 911.      For an urgent face to face visit, [place] has five urgent care centers for your convenience:      NEW:  [facility] [place] at [place] Get Driving Directions [id number] [place], [place] [address] . 10 am - 6pm Monday - Friday    [place] [facility]) Get Driving Directions [id number] [address] [place], [place] [address] . 10 am to 8 pm Monday-Friday . 12 pm to 8 pm [facility] Get Driving Directions [id number] [place], [place], [place] [address] . 8 am to 8 pm Monday-Friday . 9 am to 6 pm Saturday . 11 am to 6 pm Sunday     [facility] at [facility] Get Driving Directions  [id number] [address] [place], [place] [address] . 8 am to 8 pm Monday-Friday . 8 am to 4 pm [facility] at [place] Get Driving Directions [id number] [address] [place],  [address] . 12 pm to 6 pm Monday-Friday      Your e-visit answers were reviewed by a board certified advanced clinical practitioner to complete your personal care  plan.  Thank you for using e-Visits.   Approximately 5 minutes was used in reviewing the patient's chart, questionnaire, prescribing medications, and documentation.

## 2019-10-23 ENCOUNTER — Ambulatory Visit
Admission: EM | Admit: 2019-10-23 | Discharge: 2019-10-23 | Disposition: A | Discharge status: Home / Self Care | Payer: 59 | Attending: Physician Assistant | Admitting: Physician Assistant

## 2019-10-23 ENCOUNTER — Telehealth: Payer: No Typology Code available for payment source | Admitting: Emergency Medicine

## 2019-10-23 DIAGNOSIS — N898 Other specified noninflammatory disorders of vagina: Secondary | ICD-10-CM

## 2019-10-23 DIAGNOSIS — R399 Unspecified symptoms and signs involving the genitourinary system: Secondary | ICD-10-CM | POA: Diagnosis not present

## 2019-10-23 LAB — POCT URINALYSIS DIP (MANUAL ENTRY)
Bilirubin, UA: NEGATIVE
Blood, UA: NEGATIVE
Glucose, UA: 250 mg/dL — AB
Ketones, POC UA: NEGATIVE mg/dL
Leukocytes, UA: NEGATIVE
Nitrite, UA: NEGATIVE
Protein Ur, POC: NEGATIVE mg/dL
Spec Grav, UA: 1.025
Urobilinogen, UA: 0.2 U/dL
pH, UA: 5.5

## 2019-10-23 LAB — POCT FASTING CBG KUC MANUAL ENTRY: POCT Glucose (KUC): 230 mg/dL — AB (ref 70–99)

## 2019-10-23 MED ORDER — FLUCONAZOLE 150 MG PO TABS: 150.0000 mg | ORAL_TABLET | Freq: Every day | ORAL | 0 refills | Status: DC

## 2019-10-23 NOTE — ED Provider Notes (Signed)
[facility]    CSN: [id number] Arrival date & time: 10/23/19  1526      History   Chief Complaint Chief Complaint  Patient presents with  . Urinary Tract Infection    HPI Dawn Young is a 39 y.o. female.   39 year old female comes in for 1 week history of vaginal irritation/itching.  Since then, has also developed dysuria, urinary urgency, urinary frequency.  No obvious vaginal discharge, but did notice mild odor.  Denies abdominal pain, nausea, vomiting, diarrhea.  Denies fever, chills, body aches.  LMP 10/02/2019.  Sexually active with 1 female partner, no condom use.  New body wash use.  No history of DM.  Tried Monistat, which made symptoms worse.  Tried AZO this morning.     Past Medical History:  Diagnosis Date  . History of kidney stones   . Hx of migraine headaches   . Hx of migraines     Patient Active Problem List   Diagnosis Date Noted  . Abnormal CT scan   . Abnormal pancreas function test   . Pancreatic duct dilated   . Chronic pain of both knees 09/17/2017  . Acute bilateral low back pain without sciatica 09/17/2017  . Acute lumbar myofascial strain 09/17/2017  . Breast hypertrophy in female 06/24/2017  . Routine general medical examination at a health care facility 12/26/2015  . Thoracic back pain 12/26/2015  . Vitamin D deficiency 12/26/2015  . Iron deficiency anemia 12/26/2015  . Obesity 08/09/2013    Past Surgical History:  Procedure Laterality Date  . BREAST REDUCTION SURGERY Bilateral 06/24/2017   Procedure: MAMMARY REDUCTION  (BREAST);  Surgeon: Crissie Reese, MD;  Location: [facility];  Service: Plastics;  Laterality: Bilateral;  . BREAST SURGERY Bilateral 05/2017  . CESAREAN SECTION  03, 05, 06   x's 3  . CHOLECYSTECTOMY  2007  . ESOPHAGOGASTRODUODENOSCOPY N/A 03/02/2019   Procedure: ESOPHAGOGASTRODUODENOSCOPY (EGD);  Surgeon: Milus Banister, MD;  Location: [facility];  Service: Endoscopy;  Laterality: N/A;  . EUS N/A  03/02/2019   Procedure: UPPER ENDOSCOPIC ULTRASOUND (EUS) RADIAL;  Surgeon: Milus Banister, MD;  Location: [facility];  Service: Endoscopy;  Laterality: N/A;  . FINE NEEDLE ASPIRATION N/A 03/02/2019   Procedure: FINE NEEDLE ASPIRATION (FNA) LINEAR;  Surgeon: Milus Banister, MD;  Location: [facility];  Service: Endoscopy;  Laterality: N/A;  . TUBAL LIGATION  2006    OB History    Gravida  4   Para  4   Term      Preterm      AB      Living  4     SAB      TAB      Ectopic      Multiple      Live Births               Home Medications    Prior to Admission medications   Medication Sig Start Date End Date Taking? Authorizing Provider  amLODipine (NORVASC) 5 MG tablet Take 1 tablet (5 mg total) by mouth daily. 09/02/18   Elby Showers, MD  fluconazole (DIFLUCAN) 150 MG tablet Take 1 tablet (150 mg total) by mouth daily. Take second dose 72 hours later if symptoms still persists. 10/23/19   Ok Edwards, PA-C    Family History Family History  Problem Relation Age of Onset  . Healthy Mother   . Healthy Father   . Gout Father   .  Healthy Maternal Grandmother   . Healthy Maternal Grandfather   . Healthy Paternal Grandmother   . Healthy Paternal Grandfather   . Colon cancer Neg Hx   . Esophageal cancer Neg Hx     Social History Social History   Tobacco Use  . Smoking status: Light Tobacco Smoker    Packs/day: 0.10    Years: 5.00    Pack years: 0.50    Types: Cigarettes  . Smokeless tobacco: Never Used  . Tobacco comment: 2 cigars a week  Substance Use Topics  . Alcohol use: Yes    Comment: occasional  . Drug use: No     Allergies   Patient has no known allergies.   Review of Systems Review of Systems  Reason unable to perform ROS: See HPI as above.     Physical Exam Triage Vital Signs ED Triage Vitals  Enc Vitals Group     BP 10/23/19 1538 (!) 142/94     Pulse Rate 10/23/19 1538 81     Resp 10/23/19 1538 18     Temp 10/23/19 1538 98.5  F (36.9 C)     Temp Source 10/23/19 1538 Oral     SpO2 10/23/19 1538 98 %     Weight --      Height --      Head Circumference --      Peak Flow --      Pain Score 10/23/19 1549 4     Pain Loc --      Pain Edu? --      Excl. in [place]? --    No data found.  Updated Vital Signs BP (!) 142/94   Pulse 81   Temp 98.5 F (36.9 C) (Oral)   Resp 18   LMP 10/02/2019   SpO2 98%   Physical Exam Exam conducted with a chaperone present.  Constitutional:      General: She is not in acute distress.    Appearance: She is well-developed. She is not ill-appearing, toxic-appearing or diaphoretic.  HENT:     Head: Normocephalic and atraumatic.  Eyes:     Conjunctiva/sclera: Conjunctivae normal.     Pupils: Pupils are equal, round, and reactive to light.  Cardiovascular:     Rate and Rhythm: Normal rate and regular rhythm.  Pulmonary:     Effort: Pulmonary effort is normal. No respiratory distress.     Comments: LCTAB Abdominal:     General: Bowel sounds are normal.     Palpations: Abdomen is soft.     Tenderness: There is no abdominal tenderness. There is no right CVA tenderness, left CVA tenderness, guarding or rebound.  Genitourinary:    Comments: No open sores/lesions. Some moisture with maculopapular rash along labia minora extending to perineum.  No erythema, warmth.  No tenderness to palpation. Musculoskeletal:     Cervical back: Normal range of motion and neck supple.  Skin:    General: Skin is warm and dry.  Neurological:     Mental Status: She is alert and oriented to person, place, and time.  Psychiatric:        Behavior: Behavior normal.        Judgment: Judgment normal.      UC Treatments / Results  Labs (all labs ordered are listed, but only abnormal results are displayed) Labs Reviewed  POCT URINALYSIS DIP (MANUAL ENTRY) - Abnormal; Notable for the following components:      Result Value   Glucose, UA =250 (*)  All other components within normal limits    POCT FASTING CBG KUC MANUAL ENTRY - Abnormal; Notable for the following components:   POCT Glucose (KUC) 230 (*)    All other components within normal limits  CERVICOVAGINAL ANCILLARY ONLY    EKG   Radiology No results found.  Procedures Procedures (including critical care time)  Medications Ordered in UC Medications - No data to display  Initial Impression / Assessment and Plan / UC Course  I have reviewed the triage vital signs and the nursing notes.  Pertinent labs & imaging results that were available during my care of the patient were reviewed by me and considered in my medical decision making (see chart for details).    History and exam most consistent with yeast.  Will start Diflucan at this time.  Urine showed 250 glucose, CBG 230.  Discussed possible diabetes, with hyperglycemia, which could cause yeast onset.  At this time, will discontinue new hygiene product, and watch carbohydrate intake.  Patient with a history of yeast vaginitis, bacterial vaginitis, will send for cytology.  Return precautions given.  Otherwise patient to follow-up as scheduled for reevaluation with PCP next week.  Final Clinical Impressions(s) / UC Diagnoses   Final diagnoses:  Vaginal irritation  Symptoms of urinary tract infection    ED Prescriptions    Medication Sig Dispense Auth. Provider   fluconazole (DIFLUCAN) 150 MG tablet Take 1 tablet (150 mg total) by mouth daily. Take second dose 72 hours later if symptoms still persists. 2 tablet Ok Edwards, PA-C     PDMP not reviewed this encounter.   Ok Edwards, PA-C 10/23/19 1713

## 2019-10-23 NOTE — Progress Notes (Signed)
Based on what you shared with me, I feel your condition warrants further evaluation and I recommend that you be seen for a face to face office visit.   Please adhere to the instructions given during your e-visit on 3/27.   NOTE: If you entered your credit card information for this eVisit, you will not be charged. You may see a "hold" on your card for the $35 but that hold will drop off and you will not have a charge processed.   If you are having a true medical emergency please call 911.      For an urgent face to face visit, [place] has five urgent care centers for your convenience:      NEW:  [facility] [place] at [place] Get Driving Directions [id number] [place], [place] [address] . 10 am - 6pm Monday - Friday    [place] [facility] [place] [facility]) Get Driving Directions [id number] [address] [place], [place] [address] . 10 am to 8 pm Monday-Friday . 12 pm to 8 pm [facility]  Get Driving Directions [id number] [place], [place], [place] [address] . 8 am to 8 pm Monday-Friday . 9 am to 6 pm Saturday . 11 am to 6 pm Sunday     [facility] at [facility] Get Driving Directions  [id number] [address] [place], [place] [address] . 8 am to 8 pm Monday-Friday . 8 am to 4 pm [facility] at [place] Get Driving Directions [id number] [address] [place], [place] [address] . 12 pm to 6 pm Monday-Friday      Your e-visit answers were reviewed by a board certified advanced clinical practitioner to complete your personal care plan.  Thank you for using e-Visits.    Approximately 5 minutes was used in reviewing the patient's chart, questionnaire, prescribing medications, and documentation.

## 2019-10-23 NOTE — Discharge Instructions (Addendum)
Start Diflucan as directed.  As discussed, your sugars were slightly high, please recheck with PCP next week as scheduled for possible diabetes/insulin resistance.  At this time, decrease your carbohydrate intake.  Remain hydrated.  Refrain from hygiene product to the groin area to decrease irritation.  Monitor for any worsening of symptoms, fever, abdominal pain, nausea, vomiting, to follow up for reevaluation.

## 2019-10-23 NOTE — ED Triage Notes (Signed)
Pt c/o vaginal irritation with burning on urination with urgency and frequency. Denies vaginal discharge, states a little odor.

## 2019-10-25 LAB — CERVICOVAGINAL ANCILLARY ONLY
Bacterial vaginitis: POSITIVE — AB
Candida vaginitis: POSITIVE — AB

## 2019-10-31 ENCOUNTER — Ambulatory Visit: Payer: 59 | Admitting: Internal Medicine

## 2019-10-31 ENCOUNTER — Encounter: Payer: Self-pay | Admitting: Internal Medicine

## 2019-10-31 ENCOUNTER — Other Ambulatory Visit: Payer: Self-pay

## 2019-10-31 VITALS — BP 140/100 | HR 85 | Temp 98.4°F | Ht 65.0 in | Wt 282.0 lb

## 2019-10-31 DIAGNOSIS — E119 Type 2 diabetes mellitus without complications: Secondary | ICD-10-CM

## 2019-10-31 DIAGNOSIS — Z01818 Encounter for other preprocedural examination: Secondary | ICD-10-CM

## 2019-10-31 DIAGNOSIS — R5383 Other fatigue: Secondary | ICD-10-CM

## 2019-10-31 DIAGNOSIS — D500 Iron deficiency anemia secondary to blood loss (chronic): Secondary | ICD-10-CM

## 2019-10-31 DIAGNOSIS — D509 Iron deficiency anemia, unspecified: Secondary | ICD-10-CM | POA: Diagnosis not present

## 2019-10-31 LAB — POCT URINALYSIS DIPSTICK
Appearance: NEGATIVE
Bilirubin, UA: NEGATIVE
Blood, UA: NEGATIVE
Glucose, UA: POSITIVE — AB
Ketones, UA: NEGATIVE
Leukocytes, UA: NEGATIVE
Nitrite, UA: NEGATIVE
Odor: NEGATIVE
Protein, UA: NEGATIVE
Spec Grav, UA: 1.01 (ref 1.010–1.025)
Urobilinogen, UA: 0.2 E.U./dL
pH, UA: 6 (ref 5.0–8.0)

## 2019-10-31 LAB — POCT GLUCOSE (DEVICE FOR HOME USE): POC Glucose: 502 mg/dl — AB (ref 70–99)

## 2019-10-31 MED ORDER — SITAGLIPTIN PHOSPHATE 100 MG PO TABS: 100.0000 mg | ORAL_TABLET | Freq: Every day | ORAL | 0 refills | Status: DC

## 2019-10-31 NOTE — Progress Notes (Signed)
Subjective:    Patient ID: Dawn Young, female    DOB: 1981/01/19, 39 y.o.   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  She first presented to this office in April 2019.  She has been seen infrequently.  She has a history of iron deficiency anemia.  Had cholecystectomy 2007.  Breast reduction surgery by Dr. Harlow Mares in 2018.  Cesarean sections in 2003, 2005 in 2006.  Tubal ligation in 2006.  4 pregnancies and no miscarriages.  History of menorrhagia.  Recently seen by GYN for vaginitis and was found to have glucosuria.  She is here for this reason today mainly.  Was found to be iron deficient in 2019 likely secondary to menorrhagia.  At that time her ferritin was low at 14 and she was placed on oral iron supplement.  Do not think she has been taking this consistently because of  severe constipation which she says the iron supplement causes  She has a history of vitamin D deficiency which has been treated with high-dose vitamin D for 12 weeks in 2019 and was advised to continue with vitamin D supplement over-the-counter.  Moreover, in August 2020 she had endoscopic ultrasound by Dr. Oretha Caprice, and was found to have abnormality in the main pancreatic duct.  It was dilated in the body and tail of the pancreas.  Cytology revealed neoplastic cells.  This abnormality was discovered after CT of the abdomen and pelvis done in the emergency department for acute abdominal pain revealed hepatic steatosis and a dilated pancreatic duct with abrupt transition at the neck.  Duct was dilated to 4 mm.  No stone was noted.  Patient was subsequently referred to general surgeon, Dr. Barry Dienes but has postponed surgery.  She now wants to discuss weight loss surgery but this issue has never been resolved.  She does not complain of pain at present time.  History of right knee pain and low back pain treated with Mobic and Flexeril in 2019.  With regard to morbid  obesity, these are her weights in the past few years:  In January 2015 weight was 276 pounds 6.4 ounces  In June 2017 weight was 260 pounds 12.8 ounces  In May 2018 weight was 264 pounds 12.8 ounces  In February 2019 weight was 280 pounds 6.4 ounces  In January 2020 weight was 280 pounds  Today October 31, 2019 she weighs 282  Review of Systems no nausea, vomiting,or dyspepsia no fever or chills.     Objective:   Physical Exam Blood pressure 140/100 pulse 85 temperature 98.4 degrees pulse oximetry 98% weight 282 pounds BMI 46.93 height 5 feet 5 inches  Neck is supple without thyromegaly or carotid bruits.  Chest clear to auscultation.  Cardiac exam regular rate and rhythm.  Abdomen obese soft without tenderness or hepatomegaly.  No lower extremity edema.  Pelvic exam was deferred to GYN physician.  No lower extremity pitting edema.  Affect and thought process are within normal limits.       Assessment & Plan:  New onset diabetes mellitus.  Serum glucose is 436 and hemoglobin A1c 12.4%.  She will be treated with sliding scale regular insulin and started on Januvia 100 mg daily.  There are no ketones in her urine.  I need to see her back next week for follow-up.  She will call if Accu-Cheks are not coming under control.  I will need to follow-up  with her next week here in the office.  Addendum: I called her on Wednesday, April 7 and Accu-Chek that morning was 266.  History of iron deficiency anemia-iron level is 55, TIBC 346, ferritin low normal at 37.  Intolerant of oral iron medication as it causes severe constipation.  Refer to hematology for consideration of IV iron.  History of menorrhagia.  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 Ardis Hughs in August 2020.  Patient reports no symptoms of abdominal pain or bloating.  Needs  further evaluation.  Not sure quite why she did not proceed with surgery with Dr. Barry Dienes.  She welcomes another opinion.  Consider referral to [facility].  She is being referred to Hematology Oncology for treatment of iron deficiency and would like for them to also check into her pancreatic situation with neoplastic cells on fine-needle biopsy with endoscopic ultrasound by Dr. Ardis Hughs August 2020.  This matter needs to be resolved.  Talked with patient about this today.  We need to have an answer on this issue.  I think she understands that this could very well be a serious matter now.  I explained her that weight loss surgery will have to be deferred for now given these other issues.

## 2019-11-01 ENCOUNTER — Other Ambulatory Visit: Payer: Self-pay

## 2019-11-01 DIAGNOSIS — D509 Iron deficiency anemia, unspecified: Secondary | ICD-10-CM

## 2019-11-02 ENCOUNTER — Telehealth: Payer: Self-pay | Admitting: Nurse Practitioner

## 2019-11-02 LAB — TEST AUTHORIZATION

## 2019-11-02 LAB — COMPLETE METABOLIC PANEL WITH GFR
AG Ratio: 1 (calc) (ref 1.0–2.5)
ALT: 29 U/L (ref 6–29)
AST: 19 U/L (ref 10–30)
Albumin: 3.9 g/dL (ref 3.6–5.1)
Alkaline phosphatase (APISO): 115 U/L (ref 31–125)
BUN: 10 mg/dL (ref 7–25)
CO2: 28 mmol/L (ref 20–32)
Calcium: 9.7 mg/dL (ref 8.6–10.2)
Chloride: 97 mmol/L — ABNORMAL LOW (ref 98–110)
Creat: 0.77 mg/dL (ref 0.50–1.10)
GFR, Est African American: 114 mL/min/{1.73_m2} (ref >=60)
GFR, Est Non African American: 99 mL/min/{1.73_m2} (ref >=60)
Globulin: 3.8 g/dL (calc) — ABNORMAL HIGH (ref 1.9–3.7)
Glucose, Bld: 436 mg/dL — ABNORMAL HIGH (ref 65–99)
Potassium: 4.9 mmol/L (ref 3.5–5.3)
Sodium: 133 mmol/L — ABNORMAL LOW (ref 135–146)
Total Bilirubin: 0.2 mg/dL (ref 0.2–1.2)
Total Protein: 7.7 g/dL (ref 6.1–8.1)

## 2019-11-02 LAB — CBC WITH DIFFERENTIAL/PLATELET
Absolute Monocytes: 549 cells/uL (ref 200–950)
Basophils Absolute: 29 cells/uL (ref 0–200)
Basophils Relative: 0.3 %
Eosinophils Absolute: 59 cells/uL (ref 15–500)
Eosinophils Relative: 0.6 %
HCT: 34.2 % — ABNORMAL LOW (ref 35.0–45.0)
Hemoglobin: 10.5 g/dL — ABNORMAL LOW (ref 11.7–15.5)
Lymphs Abs: 2234 cells/uL (ref 850–3900)
MCH: 23.2 pg — ABNORMAL LOW (ref 27.0–33.0)
MCHC: 30.7 g/dL — ABNORMAL LOW (ref 32.0–36.0)
MCV: 75.5 fL — ABNORMAL LOW (ref 80.0–100.0)
MPV: 11.5 fL (ref 7.5–12.5)
Monocytes Relative: 5.6 %
Neutro Abs: 6929 cells/uL (ref 1500–7800)
Neutrophils Relative %: 70.7 %
Platelets: 304 10*3/uL (ref 140–400)
RBC: 4.53 10*6/uL (ref 3.80–5.10)
RDW: 16.3 % — ABNORMAL HIGH (ref 11.0–15.0)
Total Lymphocyte: 22.8 %
WBC: 9.8 10*3/uL (ref 3.8–10.8)

## 2019-11-02 LAB — IRON,TIBC AND FERRITIN PANEL
%SAT: 16 % (calc) (ref 16–45)
Ferritin: 37 ng/mL (ref 16–154)
Iron: 55 ug/dL (ref 40–190)
TIBC: 346 mcg/dL (calc) (ref 250–450)

## 2019-11-02 LAB — HEMOGLOBIN A1C
Hgb A1c MFr Bld: 12.4 % of total Hgb — ABNORMAL HIGH (ref <5.7)
Mean Plasma Glucose: 309 (calc)
eAG (mmol/L): 17.1 (calc)

## 2019-11-02 LAB — TSH: TSH: 2.24 mIU/L

## 2019-11-02 MED ORDER — INSULIN NPH (HUMAN) (ISOPHANE) 100 UNIT/ML ~~LOC~~ SUSP: SUBCUTANEOUS | 1 refills | Status: DC

## 2019-11-02 NOTE — Telephone Encounter (Signed)
Received a new hem referral from Dr. Renold Genta for anemia. Dawn Young returned my call and has been scheduled to see Lacie on 4.15 at 1:45pm. Pt aware to arrive 15 minutes early.

## 2019-11-02 NOTE — Patient Instructions (Signed)
You have been placed on sliding scale insulin and Januvia.  Follow-up here in 1 week.  We are referring you to hematology oncology regarding pancreatic stricture with nevus plastic cells on endoscopic ultrasound in August 2020 and also for intolerance of oral iron and iron deficiency anemia.  Weight loss surgery needs to be postponed until GI situation is clarified and diabetes has been controlled.

## 2019-11-07 ENCOUNTER — Encounter: Payer: Self-pay | Admitting: Internal Medicine

## 2019-11-07 ENCOUNTER — Ambulatory Visit: Payer: 59 | Admitting: Internal Medicine

## 2019-11-07 ENCOUNTER — Other Ambulatory Visit: Payer: Self-pay

## 2019-11-07 VITALS — BP 120/90 | HR 85 | Temp 97.9°F | Ht 65.0 in | Wt 283.0 lb

## 2019-11-07 DIAGNOSIS — I10 Essential (primary) hypertension: Secondary | ICD-10-CM

## 2019-11-07 DIAGNOSIS — D509 Iron deficiency anemia, unspecified: Secondary | ICD-10-CM

## 2019-11-07 DIAGNOSIS — E119 Type 2 diabetes mellitus without complications: Secondary | ICD-10-CM | POA: Diagnosis not present

## 2019-11-07 DIAGNOSIS — R978 Other abnormal tumor markers: Secondary | ICD-10-CM | POA: Diagnosis not present

## 2019-11-07 DIAGNOSIS — K8689 Other specified diseases of pancreas: Secondary | ICD-10-CM | POA: Diagnosis not present

## 2019-11-07 DIAGNOSIS — R970 Elevated carcinoembryonic antigen [CEA]: Secondary | ICD-10-CM | POA: Diagnosis not present

## 2019-11-07 MED ORDER — AMLODIPINE BESYLATE 5 MG PO TABS: 5.0000 mg | ORAL_TABLET | Freq: Every day | ORAL | 1 refills | Status: DC

## 2019-11-07 NOTE — Progress Notes (Signed)
   Subjective:    Patient ID: Dawn Young, female    DOB: 07/05/1981, 39 y.o.   MRN: [id number]  HPI 39 year old Female here today to follow-up on new onset diabetes mellitus.  At last visit on April 6,which was for health maintenance exam and evaluation for weight loss surgery, she was found to have hemoglobin A1c 12.4%.  Fasting glucose was 436. She was also found to be anemic with hemoglobin 10.5 g and MCV of 75.5.  Iron was low normal at 55 and ferritin was 37.  Patient has history of not tolerating iron supplement very well.  She will be referred to hematology for consideration of IV iron infusion.  At last visit on April 6, she needed surgical clearance for bypass surgery for weight loss.  I was not in favor of that due to unresolved issues with pancreas.  See details in dictation on April 6.  She has an appointment at [facility] today with GI oncology for evaluation.  She also has upcoming appointment with hematologist here locally to see if she is a candidate for IV iron infusion.  Tells me that with sliding scale regular insulin and Januvia 100 mg daily that Accu-Chek is now in the low 200 range which is excellent.  She will continue to work on diet exercise and weight loss.  She is not having to use sliding scale insulin at the present time.   Review of Systems see above     Objective:   Physical Exam Vital signs reviewed.  She looks relaxed and in no acute distress.  Spent 15 minutes speaking with her about upcoming appointment at [facility], recent Accu-Chek readings.  Urinary frequency has decreased with improvement in glucose control.       Assessment & Plan:  New onset diabetes with considerable improvement with Januvia and sliding scale insulin.  Currently not having to use sliding scale insulin as glucose has come under good control.  She will continue with Januvia 100 mg daily.  ?  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  She does not tolerate oral iron well.

## 2019-11-07 NOTE — Progress Notes (Addendum)
[place]  [phone] [phone]  Clinic New consult Note   Patient Care Team: Baxley, Cresenciano Lick, MD as PCP - General (Internal Medicine) Milus Banister, MD as Attending Physician (Gastroenterology) Stark Klein, MD as Consulting Physician (Surgical Oncology) 11/09/2019  CHIEF COMPLAINTS/PURPOSE OF CONSULTATION:  Anemia, referred by Tedra Senegal, MD  HISTORY OF PRESENTING ILLNESS:  Dawn Young 39 y.o. female with history of pancreas mass, recently diagnosed DM, migraines, vitamin D deficiency (12 on 11/04/2017) and iron deficiency is here because of anemia. She reports having abnormal CBC with iron deficiency anemia since 2006 when her last child was born. In 05/21/2014 when her Epic records begin she had Hgb 10.1 with low MCV 72.6 and mildly elevated RDW 16.4. Initial iron studies on 12/26/2015 showed low serum iron of 30 with 8% saturation ratio and Hgb of 11.1.  Her lowest hemoglobin of 9.9 was recorded on 11/04/2017. Iron studies on 11/08/17 showed serum iron 28, 9% transferrin saturation, ferritin in low normal range 14 and normal TIBC 312. Repeat studies on 02/07/18 were relatively unchanged. On 10/31/19 her iron studies normalized to serum iron 55, 16% transferrin saturation and ferritin 37 with Hgb 10.5. She has tried various forms of oral iron in the past but developed severe constipation despite laxatives. she has never received IV iron. Denies h/o drug reaction/allergies. She has h/o menorrhagia, heavy menses monthly where she saturates pad hourly for first 4 days of 7-8 day period. She has mild gum bleeding with brushing, she has seen a dentist for this; otherwise denies bleeding.  She plans to see new Ob/gyn Dr. Garwin Brothers soon to discuss options to reduce menstrual bleeding.   Additionally she presented to ED on 01/16/19 with abdominal/epigastric pain. Labs in the ED showed mild stable anemia 11.9, normal lipase, and unremarkable LFTs. CT AP w contrast  on 01/16/19 showed dense hepatic steatosis with dilated main pancreatic duct with abrupt transition at the neck. No underlying mass was seen. MRI with MRCP was done on 02/13/19 which showed pancreatic duct dilatation within the body and tail to the level of the neck where abrupt transition occurs. Again no mass was seen. Pancreatitis induced stricture could have the appearance. She underwent EUS by Dr. Ardis Hughs on 03/02/19 which showed slightly abnormal pancreatic parenchyma in the neck at the site of abrupt transition. No clear mass was seen. Cytology from FNA showed neoplastic cells, the differential included neuroendocrine tumor vs solid pseudopapillary tumor. There was insufficient cellularity for IHC testing. She was referred to Dr. Barry Dienes who recommended surgery which she ultimately did not schedule at the time due to insurance. She has insurance now and was seen by Dr. Mariah Milling at [facility]. The plan is to repeat CT and EUS on 11/23/19 with plans for surgical resection in the future.   Socially, she lives with 3 of her 4 children and boy friend. She works as a Chartered certified accountant at [employer]. She is independent with ADLs. She denies tobacco, alcohol, or drug use. She is UTD on PAP smear, no colonoscopy or mammogram yet. Denies family history of cancer, blood disorder, or anemia.   Today, she presents alone. She reports stable fatigue at baseline, but able to carry on normal function. Appetite and weight are normal for her. Denies fever, chills, cough, chest pain, dyspnea, n/v/c/d, or pain. She is undergoing work up at [facility] for pancreas.    MEDICAL HISTORY:  Past Medical History:  Diagnosis Date  . History of kidney stones   .  Hx of migraine headaches   . Hx of migraines     SURGICAL HISTORY: Past Surgical History:  Procedure Laterality Date  . BREAST REDUCTION SURGERY Bilateral 06/24/2017   Procedure: MAMMARY REDUCTION  (BREAST);  Surgeon: Crissie Reese, MD;  Location: [place];  Service: Plastics;  Laterality:  Bilateral;  . BREAST SURGERY Bilateral 05/2017  . CESAREAN SECTION  03, 05, 06   x's 3  . CHOLECYSTECTOMY  2007  . ESOPHAGOGASTRODUODENOSCOPY N/A 03/02/2019   Procedure: ESOPHAGOGASTRODUODENOSCOPY (EGD);  Surgeon: Milus Banister, MD;  Location: [facility];  Service: Endoscopy;  Laterality: N/A;  . EUS N/A 03/02/2019   Procedure: UPPER ENDOSCOPIC ULTRASOUND (EUS) RADIAL;  Surgeon: Milus Banister, MD;  Location: [facility];  Service: Endoscopy;  Laterality: N/A;  . FINE NEEDLE ASPIRATION N/A 03/02/2019   Procedure: FINE NEEDLE ASPIRATION (FNA) LINEAR;  Surgeon: Milus Banister, MD;  Location: [facility];  Service: Endoscopy;  Laterality: N/A;  . TUBAL LIGATION  2006    SOCIAL HISTORY: Social History   Socioeconomic History  . Marital status: Single    Spouse name: Not on file  . Number of children: 4  . Years of education: 3  . Highest education level: Not on file  Occupational History  . Occupation: CNA  Tobacco Use  . Smoking status: Former Smoker    Packs/day: 0.10    Years: 5.00    Pack years: 0.50    Types: Cigarettes  . Smokeless tobacco: Never Used  . Tobacco comment: 2 cigars a week  Substance and Sexual Activity  . Alcohol use: Yes    Comment: occasional  . Drug use: No  . Sexual activity: Yes    Birth control/protection: None  Other Topics Concern  . Not on file  Social History Narrative   Fun: Rest   Denies abuse and feels safe at home   Social Determinants of Health   Financial Resource Strain:   . Difficulty of Paying Living Expenses:   Food Insecurity:   . Worried About Charity fundraiser in the Last Year:   . Arboriculturist in the Last Year:   Transportation Needs:   . Film/video editor (Medical):   Marland Kitchen Lack of Transportation (Non-Medical):   Physical Activity:   . Days of Exercise per Week:   . Minutes of Exercise per Session:   Stress:   . Feeling of Stress :   Social Connections:   . Frequency of Communication with Friends and  Family:   . Frequency of Social Gatherings with Friends and Family:   . Attends Religious Services:   . Active Member of Clubs or Organizations:   . Attends Archivist Meetings:   Marland Kitchen Marital Status:   Intimate Partner Violence:   . Fear of Current or Ex-Partner:   . Emotionally Abused:   Marland Kitchen Physically Abused:   . Sexually Abused:     FAMILY HISTORY: Family History  Problem Relation Age of Onset  . Healthy Mother   . Healthy Father   . Gout Father   . Healthy Maternal Grandmother   . Healthy Maternal Grandfather   . Healthy Paternal Grandmother   . Healthy Paternal Grandfather   . Colon cancer Neg Hx   . Esophageal cancer Neg Hx     ALLERGIES:  has No Known Allergies.  MEDICATIONS:  Current Outpatient Medications  Medication Sig Dispense Refill  . amLODipine (NORVASC) 5 MG tablet Take 1 tablet (5 mg total) by mouth daily.  90 tablet 1  . insulin NPH Human (HUMULIN N) 100 UNIT/ML injection Follow guidelines of sliding scale 10 mL 1  . Lancets (FREESTYLE) lancets SMARTSIG:1 Each Topical 4 Times Daily    . sitaGLIPtin (JANUVIA) 100 MG tablet Take 1 tablet (100 mg total) by mouth daily. 30 tablet 0  . FREESTYLE LITE test strip 1 each 4 (four) times daily.     No current facility-administered medications for this visit.    REVIEW OF SYSTEMS:   Constitutional: Denies fevers, chills or abnormal night sweats (+) fatigue  Eyes: Denies blurriness of vision, double vision or watery eyes Ears, nose, mouth, throat, and face: Denies mucositis or sore throat Respiratory: Denies cough, dyspnea or wheezes Cardiovascular: Denies palpitation, chest discomfort or lower extremity swelling Gastrointestinal:  Denies nausea, vomiting, constipation, diarrhea, pain, heartburn or change in bowel habits (+) constipation on oral iron  Skin: Denies abnormal skin rashes Lymphatics: Denies new lymphadenopathy or easy bruising Neurological:Denies numbness, tingling or new  weaknesses Behavioral/Psych: Mood is stable, no new changes  All other systems were reviewed with the patient and are negative.  PHYSICAL EXAMINATION: ECOG PERFORMANCE STATUS: 0 - Asymptomatic  Vitals:   11/09/19 1356  BP: (!) 149/84  Pulse: 79  Resp: 20  Temp: 98 F (36.7 C)  SpO2: 100%   Filed Weights   11/09/19 1356  Weight: 289 lb 4.8 oz (131.2 kg)    GENERAL:alert, no distress and comfortable SKIN: no rash  EYES: sclera clear OROPHARYNX:mild gingival hypertrophy at the upper right gumline  NECK: without mass  LUNGS: clear with normal breathing effort HEART: regular rate & rhythm, no lower extremity edema ABDOMEN:abdomen soft, non-tender and normal bowel sounds PSYCH: alert & oriented x 3 with fluent speech NEURO: no focal motor/sensory deficits  LABORATORY DATA:  I have reviewed the data as listed CBC Latest Ref Rng & Units 10/31/2019 01/18/2019 01/16/2019  WBC 3.8 - 10.8 Thousand/uL 9.8 9.2 10.0  Hemoglobin 11.7 - 15.5 g/dL 10.5(L) 11.9(L) 10.7(L)  Hematocrit 35.0 - 45.0 % 34.2(L) 37.8 33.1(L)  Platelets 140 - 400 Thousand/uL 304 343 288    CMP Latest Ref Rng & Units 10/31/2019 01/18/2019 01/16/2019  Glucose 65 - 99 mg/dL 436(H) 137(H) 175(H)  BUN 7 - 25 mg/dL 10 8 9   Creatinine 0.50 - 1.10 mg/dL 0.77 0.90 0.68  Sodium 135 - 146 mmol/L 133(L) 136 134(L)  Potassium 3.5 - 5.3 mmol/L 4.9 4.0 4.1  Chloride 98 - 110 mmol/L 97(L) 103 102  CO2 20 - 32 mmol/L 28 22 23   Calcium 8.6 - 10.2 mg/dL 9.7 9.6 9.4  Total Protein 6.1 - 8.1 g/dL 7.7 8.6(H) 7.6  Total Bilirubin 0.2 - 1.2 mg/dL 0.2 0.6 0.7  Alkaline Phos 38 - 126 U/L - 100 86  AST 10 - 30 U/L 19 19 15   ALT 6 - 29 U/L 29 20 18      RADIOGRAPHIC STUDIES: I have personally reviewed the radiological images as listed and agreed with the findings in the report. No results found.  ASSESSMENT & PLAN: 39 yo female with   1. Anemia with mild iron deficiency  -We reviewed her medical record in detail with the patient.  She has a history of iron deficiency anemia that predates her available Epic records -She reports a heavy menstrual flow for 4 of 8 day cycle every month, she has menorrhagia. She has plans to see Ob/gyn Dr. Garwin Brothers to discuss options to reduce menstrual bleeding.  -her Hgb ranges 9 - 11 since 2015.  Serum iron 28 in 2019 with transferrin saturation <10. Ferritin has been in low normal range 14-37. Her iron panel recently normalized in 10/31/2019. She has been on oral iron formulations periodically but does not tolerate well due to severe constipation.  -Despite normal iron level, she continues to have mild anemia Hgb 10.5 with low MCV 75.5. we will check hemoglobin electrophoresis to r/o thalassemia  -given her poor tolerance to oral iron, and menorrhagia we are recommending one dose IV Feraheme. She was informed of the benefits and potential risk including anaphylaxis. We will give her premedication. She was informed she will need a driver. She agrees to proceed.  -she will return for lab and IV Feraheme x1 in 2 weeks per patient's request.  -I recommend routine lab next month to evaluate her response to iron. She can get labs done here or at PCP -will cc my note to Dr. Renold Genta   2. Neoplastic cells of the pancreas -we reviewed her work up which started in 12/2019 when she presented to ED for acute abdominal/epigastric pain -CT AP on 01/16/19 showed dense hepatic steatosis with dilated main pancreatic duct with abrupt transition at the neck. No underlying mass was seen.  -MRI with MRCP on 02/13/19 showed pancreatic duct dilatation within the body and tail to the level of the neck where abrupt transition occurs. Again no mass was seen.  -She underwent EUS by Dr. Ardis Hughs on 03/02/19 which showed slightly abnormal pancreatic parenchyma in the neck at the site of abrupt transition. No clear mass was seen.  -Cytology from FNA showed neoplastic cells, the differential included neuroendocrine tumor vs IPMN. There was  insufficient cellularity for IHC testing.  -She was referred to Dr. Barry Dienes who recommended surgery which she ultimately did not schedule at the time due to insurance issues.  -Seen by Dr. Mariah Milling at [facility] on 11/08/19. The plan is to repeat CT and EUS on 11/23/19 with plans for surgical resection in the future.  -We can see her back for this if needed in the future  3. DM -recently diagnosed, on sitagliptin and insulin  -followed by PCP    PLAN: -Medical record reviewed  -Proceed with 1 dose IV Feraheme with pre-meds in 2 weeks (patient's preference), will need a driver  -Lab to r/o thalassemia when she returns for Feraheme  -Repeat CBC and iron studies next month to evaluate response to iron, then q2-3 months either at [facility] or PCP  -Cc note to PCP Dr. Renold Genta  -F/u in 6 months, or sooner if she has worsening anemia or other concerns.    Orders Placed This Encounter  Procedures  . Hgb Fractionation Cascade    R/o thalassemia    Standing Status:   Standing    Number of Occurrences:   1    Standing Expiration Date:   11/08/2020  . CBC with Differential (Cancer Center Only)    Standing Status:   Standing    Number of Occurrences:   50    Standing Expiration Date:   11/08/2020  . Iron and TIBC    Standing Status:   Standing    Number of Occurrences:   50    Standing Expiration Date:   11/08/2020  . Ferritin    Standing Status:   Standing    Number of Occurrences:   50    Standing Expiration Date:   11/08/2020   No problem-specific Assessment & Plan notes found for this encounter.    All questions were answered. The  patient knows to call the clinic with any problems, questions or concerns.     Alla Feeling, NP 11/09/19   Addendum  I have seen the patient, examined her. I agree with the assessment and and plan and have edited the notes.   Dawn Young is a 39 yo female with iron deficient anemia from menorrhagia.  She does not tolerate oral iron, will arrange IV Feraheme.  We  discussed the benefits and potential side effects, especially allergy reactions.  We will give premedication.  I also encourage her to try liquid iron or prenatal vitamins, and iron rich food.  We will monitor her iron level and CBC.   Her previous CT and MRI of abdomen in 2020 showed pancreatic duct dilatation to the level of the pancreatic neck.  She underwent EUS and fine-needle biopsy which showed malignant cells, favor neuroendocrine tumor or IPMN.  She lost follow-up, but was seen by pancreatobiliary surgeon Dr. Mariah Milling at [facility] lately.  I encourage her to complete restaging scan and the biopsy, and consider surgery if needed. She agrees.  Truitt Merle  11/09/2019

## 2019-11-08 DIAGNOSIS — K8689 Other specified diseases of pancreas: Secondary | ICD-10-CM | POA: Diagnosis not present

## 2019-11-09 ENCOUNTER — Other Ambulatory Visit: Payer: Self-pay

## 2019-11-09 ENCOUNTER — Telehealth: Payer: Self-pay | Admitting: Nurse Practitioner

## 2019-11-09 ENCOUNTER — Encounter: Payer: Self-pay | Admitting: Nurse Practitioner

## 2019-11-09 ENCOUNTER — Inpatient Hospital Stay: Payer: 59 | Attending: Nurse Practitioner | Admitting: Nurse Practitioner

## 2019-11-09 VITALS — BP 149/84 | HR 79 | Temp 98.0°F | Resp 20 | Ht 65.0 in | Wt 289.3 lb

## 2019-11-09 DIAGNOSIS — E119 Type 2 diabetes mellitus without complications: Secondary | ICD-10-CM | POA: Diagnosis not present

## 2019-11-09 DIAGNOSIS — Z87891 Personal history of nicotine dependence: Secondary | ICD-10-CM | POA: Diagnosis not present

## 2019-11-09 DIAGNOSIS — D500 Iron deficiency anemia secondary to blood loss (chronic): Secondary | ICD-10-CM | POA: Diagnosis not present

## 2019-11-09 DIAGNOSIS — D499 Neoplasm of unspecified behavior of unspecified site: Secondary | ICD-10-CM | POA: Diagnosis not present

## 2019-11-09 DIAGNOSIS — Z794 Long term (current) use of insulin: Secondary | ICD-10-CM

## 2019-11-09 DIAGNOSIS — D509 Iron deficiency anemia, unspecified: Secondary | ICD-10-CM | POA: Insufficient documentation

## 2019-11-09 NOTE — Patient Instructions (Signed)
Patient to be seen at [facility] today regarding pancreatic issue.  Continue Januvia 100 mg daily.  Use sliding scale regular insulin if necessary.  Follow-up here in May.  Is also to see hematologist here regarding iron deficiency anemia and possibility of receiving IV iron infusion as she does not tolerate oral iron supplements.

## 2019-11-09 NOTE — Telephone Encounter (Signed)
Scheduled per los. Patient declined printout  

## 2019-11-23 ENCOUNTER — Telehealth: Payer: Self-pay | Admitting: Nurse Practitioner

## 2019-11-23 ENCOUNTER — Telehealth: Payer: Self-pay | Admitting: *Deleted

## 2019-11-23 DIAGNOSIS — K8689 Other specified diseases of pancreas: Secondary | ICD-10-CM | POA: Diagnosis not present

## 2019-11-23 DIAGNOSIS — K760 Fatty (change of) liver, not elsewhere classified: Secondary | ICD-10-CM | POA: Diagnosis not present

## 2019-11-23 NOTE — Telephone Encounter (Signed)
Per Cira Rue, NP, called and left message on pt personal vmail. Notified pt of changes to iron infusion per insurance requirements. Advised pt if there were any questions or concerns to call facility

## 2019-11-23 NOTE — Progress Notes (Signed)
Intravenous Iron Formulation Change  Ms. Griswell has insurance that requires a change in intravenous iron product from Feraheme to Venofer. Orders have been updated to reflect this change and scheduling message sent to adjust infusion appointments. Lacie notified and agrees with the plan. Premeds were kept the same per the previous feraheme treatment. Porsche calling the patient to update her on the change.   The plan for iron therapy is as follows: Venofer 200mg  IV twice weekly x5 infusions.   Norwood Levo [facility] 11/23/2019

## 2019-11-23 NOTE — Telephone Encounter (Signed)
Scheduled appt per 4/29 sch message - left message for patient that appts have been added

## 2019-11-24 ENCOUNTER — Inpatient Hospital Stay: Payer: 59

## 2019-11-24 ENCOUNTER — Other Ambulatory Visit: Payer: Self-pay

## 2019-11-24 VITALS — BP 139/87 | HR 58 | Temp 98.4°F | Resp 17

## 2019-11-24 DIAGNOSIS — D499 Neoplasm of unspecified behavior of unspecified site: Secondary | ICD-10-CM | POA: Diagnosis not present

## 2019-11-24 DIAGNOSIS — Z87891 Personal history of nicotine dependence: Secondary | ICD-10-CM | POA: Diagnosis not present

## 2019-11-24 DIAGNOSIS — E119 Type 2 diabetes mellitus without complications: Secondary | ICD-10-CM | POA: Diagnosis not present

## 2019-11-24 DIAGNOSIS — D500 Iron deficiency anemia secondary to blood loss (chronic): Secondary | ICD-10-CM

## 2019-11-24 DIAGNOSIS — Z794 Long term (current) use of insulin: Secondary | ICD-10-CM | POA: Diagnosis not present

## 2019-11-24 DIAGNOSIS — D509 Iron deficiency anemia, unspecified: Secondary | ICD-10-CM | POA: Diagnosis not present

## 2019-11-24 LAB — CBC WITH DIFFERENTIAL (CANCER CENTER ONLY)
Abs Immature Granulocytes: 0.02 10*3/uL (ref 0.00–0.07)
Basophils Absolute: 0 10*3/uL (ref 0.0–0.1)
Basophils Relative: 0 %
Eosinophils Absolute: 0.1 10*3/uL (ref 0.0–0.5)
Eosinophils Relative: 1 %
HCT: 33.8 % — ABNORMAL LOW (ref 36.0–46.0)
Hemoglobin: 10.9 g/dL — ABNORMAL LOW (ref 12.0–15.0)
Immature Granulocytes: 0 %
Lymphocytes Relative: 29 %
Lymphs Abs: 1.9 10*3/uL (ref 0.7–4.0)
MCH: 24.1 pg — ABNORMAL LOW (ref 26.0–34.0)
MCHC: 32.2 g/dL (ref 30.0–36.0)
MCV: 74.8 fL — ABNORMAL LOW (ref 80.0–100.0)
Monocytes Absolute: 0.5 10*3/uL (ref 0.1–1.0)
Monocytes Relative: 7 %
Neutro Abs: 4.3 10*3/uL (ref 1.7–7.7)
Neutrophils Relative %: 63 %
Platelet Count: 291 10*3/uL (ref 150–400)
RBC: 4.52 MIL/uL (ref 3.87–5.11)
RDW: 16.8 % — ABNORMAL HIGH (ref 11.5–15.5)
WBC Count: 6.8 10*3/uL (ref 4.0–10.5)
nRBC: 0 % (ref 0.0–0.2)

## 2019-11-24 LAB — IRON AND TIBC
Iron: 27 ug/dL — ABNORMAL LOW (ref 41–142)
Saturation Ratios: 7 % — ABNORMAL LOW (ref 21–57)
TIBC: 394 ug/dL (ref 236–444)
UIBC: 367 ug/dL (ref 120–384)

## 2019-11-24 LAB — FERRITIN: Ferritin: 44 ng/mL (ref 11–307)

## 2019-11-24 MED ORDER — FAMOTIDINE IN NACL 20-0.9 MG/50ML-% IV SOLN
INTRAVENOUS | Status: AC
  Filled 2019-11-24: qty 50

## 2019-11-24 MED ORDER — DIPHENHYDRAMINE HCL 25 MG PO CAPS
ORAL_CAPSULE | ORAL | Status: AC
  Filled 2019-11-24: qty 2

## 2019-11-24 MED ORDER — ACETAMINOPHEN 325 MG PO TABS
ORAL_TABLET | ORAL | Status: AC
  Filled 2019-11-24: qty 2

## 2019-11-24 MED ORDER — FAMOTIDINE IN NACL 20-0.9 MG/50ML-% IV SOLN
20.0000 mg | Freq: Once | INTRAVENOUS | Status: AC
  Administered 2019-11-24: 20 mg via INTRAVENOUS

## 2019-11-24 MED ORDER — ACETAMINOPHEN 325 MG PO TABS
650.0000 mg | ORAL_TABLET | Freq: Once | ORAL | Status: AC
  Administered 2019-11-24: 650 mg via ORAL

## 2019-11-24 MED ORDER — DIPHENHYDRAMINE HCL 25 MG PO CAPS
50.0000 mg | ORAL_CAPSULE | Freq: Once | ORAL | Status: AC
  Administered 2019-11-24: 09:00:00 50 mg via ORAL

## 2019-11-24 MED ORDER — SODIUM CHLORIDE 0.9 % IV SOLN
200.0000 mg | Freq: Once | INTRAVENOUS | Status: AC
  Administered 2019-11-24: 200 mg via INTRAVENOUS
  Filled 2019-11-24: qty 200

## 2019-11-24 MED ORDER — SODIUM CHLORIDE 0.9 % IV SOLN
Freq: Once | INTRAVENOUS | Status: AC
  Filled 2019-11-24: qty 250

## 2019-11-24 NOTE — Patient Instructions (Signed)

## 2019-11-27 ENCOUNTER — Ambulatory Visit: Payer: 59 | Attending: Internal Medicine

## 2019-11-27 ENCOUNTER — Other Ambulatory Visit: Payer: Self-pay | Admitting: Internal Medicine

## 2019-11-27 DIAGNOSIS — Z20822 Contact with and (suspected) exposure to covid-19: Secondary | ICD-10-CM | POA: Diagnosis not present

## 2019-11-27 DIAGNOSIS — E119 Type 2 diabetes mellitus without complications: Secondary | ICD-10-CM

## 2019-11-27 LAB — HGB SOLUBILITY: Hgb Solubility: POSITIVE — AB

## 2019-11-27 LAB — HGB FRACTIONATION CASCADE
Hgb A2: 2.3 % (ref 1.8–3.2)
Hgb A: 59.5 % — ABNORMAL LOW (ref 96.4–98.8)
Hgb F: 0 % (ref 0.0–2.0)
Hgb S: 38.2 % — ABNORMAL HIGH

## 2019-11-28 ENCOUNTER — Other Ambulatory Visit: Payer: Self-pay

## 2019-11-28 ENCOUNTER — Inpatient Hospital Stay: Payer: 59 | Attending: Nurse Practitioner

## 2019-11-28 VITALS — BP 137/89 | HR 60 | Temp 98.1°F | Resp 18

## 2019-11-28 DIAGNOSIS — D499 Neoplasm of unspecified behavior of unspecified site: Secondary | ICD-10-CM | POA: Insufficient documentation

## 2019-11-28 DIAGNOSIS — D500 Iron deficiency anemia secondary to blood loss (chronic): Secondary | ICD-10-CM | POA: Insufficient documentation

## 2019-11-28 DIAGNOSIS — E119 Type 2 diabetes mellitus without complications: Secondary | ICD-10-CM | POA: Diagnosis not present

## 2019-11-28 DIAGNOSIS — N920 Excessive and frequent menstruation with regular cycle: Secondary | ICD-10-CM | POA: Diagnosis not present

## 2019-11-28 DIAGNOSIS — Z794 Long term (current) use of insulin: Secondary | ICD-10-CM | POA: Diagnosis not present

## 2019-11-28 DIAGNOSIS — C7A098 Malignant carcinoid tumors of other sites: Secondary | ICD-10-CM | POA: Diagnosis not present

## 2019-11-28 DIAGNOSIS — Z87891 Personal history of nicotine dependence: Secondary | ICD-10-CM | POA: Diagnosis not present

## 2019-11-28 DIAGNOSIS — K8689 Other specified diseases of pancreas: Secondary | ICD-10-CM | POA: Diagnosis not present

## 2019-11-28 LAB — NOVEL CORONAVIRUS, NAA: SARS-CoV-2, NAA: NOT DETECTED

## 2019-11-28 LAB — SARS-COV-2, NAA 2 DAY TAT

## 2019-11-28 MED ORDER — FAMOTIDINE IN NACL 20-0.9 MG/50ML-% IV SOLN
20.0000 mg | Freq: Once | INTRAVENOUS | Status: AC
  Administered 2019-11-28: 20 mg via INTRAVENOUS

## 2019-11-28 MED ORDER — ACETAMINOPHEN 325 MG PO TABS
650.0000 mg | ORAL_TABLET | Freq: Once | ORAL | Status: AC
  Administered 2019-11-28: 650 mg via ORAL

## 2019-11-28 MED ORDER — DIPHENHYDRAMINE HCL 25 MG PO CAPS
50.0000 mg | ORAL_CAPSULE | Freq: Once | ORAL | Status: AC
  Administered 2019-11-28: 50 mg via ORAL

## 2019-11-28 MED ORDER — FAMOTIDINE IN NACL 20-0.9 MG/50ML-% IV SOLN
INTRAVENOUS | Status: AC
  Filled 2019-11-28: qty 50

## 2019-11-28 MED ORDER — DIPHENHYDRAMINE HCL 25 MG PO CAPS
ORAL_CAPSULE | ORAL | Status: AC
  Filled 2019-11-28: qty 2

## 2019-11-28 MED ORDER — SODIUM CHLORIDE 0.9 % IV SOLN
200.0000 mg | Freq: Once | INTRAVENOUS | Status: AC
  Administered 2019-11-28: 200 mg via INTRAVENOUS
  Filled 2019-11-28: qty 200

## 2019-11-28 MED ORDER — ACETAMINOPHEN 325 MG PO TABS
ORAL_TABLET | ORAL | Status: AC
  Filled 2019-11-28: qty 2

## 2019-11-28 MED ORDER — SODIUM CHLORIDE 0.9 % IV SOLN
Freq: Once | INTRAVENOUS | Status: AC
  Filled 2019-11-28: qty 250

## 2019-11-28 NOTE — Patient Instructions (Signed)

## 2019-11-30 ENCOUNTER — Ambulatory Visit: Payer: 59

## 2019-11-30 DIAGNOSIS — K760 Fatty (change of) liver, not elsewhere classified: Secondary | ICD-10-CM | POA: Diagnosis not present

## 2019-11-30 DIAGNOSIS — D509 Iron deficiency anemia, unspecified: Secondary | ICD-10-CM | POA: Diagnosis not present

## 2019-11-30 DIAGNOSIS — Z9049 Acquired absence of other specified parts of digestive tract: Secondary | ICD-10-CM | POA: Diagnosis not present

## 2019-11-30 DIAGNOSIS — I10 Essential (primary) hypertension: Secondary | ICD-10-CM | POA: Diagnosis not present

## 2019-11-30 DIAGNOSIS — Z6841 Body Mass Index (BMI) 40.0 and over, adult: Secondary | ICD-10-CM | POA: Diagnosis not present

## 2019-11-30 DIAGNOSIS — R599 Enlarged lymph nodes, unspecified: Secondary | ICD-10-CM | POA: Diagnosis not present

## 2019-11-30 DIAGNOSIS — E119 Type 2 diabetes mellitus without complications: Secondary | ICD-10-CM | POA: Diagnosis not present

## 2019-11-30 DIAGNOSIS — R933 Abnormal findings on diagnostic imaging of other parts of digestive tract: Secondary | ICD-10-CM | POA: Diagnosis not present

## 2019-11-30 DIAGNOSIS — I898 Other specified noninfective disorders of lymphatic vessels and lymph nodes: Secondary | ICD-10-CM | POA: Diagnosis not present

## 2019-11-30 DIAGNOSIS — R590 Localized enlarged lymph nodes: Secondary | ICD-10-CM | POA: Diagnosis not present

## 2019-11-30 DIAGNOSIS — K8689 Other specified diseases of pancreas: Secondary | ICD-10-CM | POA: Diagnosis not present

## 2019-12-01 ENCOUNTER — Ambulatory Visit: Payer: 59

## 2019-12-01 ENCOUNTER — Inpatient Hospital Stay: Payer: 59

## 2019-12-01 ENCOUNTER — Other Ambulatory Visit: Payer: Self-pay

## 2019-12-01 VITALS — BP 137/89 | HR 76 | Temp 98.6°F | Resp 18

## 2019-12-01 DIAGNOSIS — E119 Type 2 diabetes mellitus without complications: Secondary | ICD-10-CM | POA: Diagnosis not present

## 2019-12-01 DIAGNOSIS — D499 Neoplasm of unspecified behavior of unspecified site: Secondary | ICD-10-CM | POA: Diagnosis not present

## 2019-12-01 DIAGNOSIS — N920 Excessive and frequent menstruation with regular cycle: Secondary | ICD-10-CM | POA: Diagnosis not present

## 2019-12-01 DIAGNOSIS — Z794 Long term (current) use of insulin: Secondary | ICD-10-CM | POA: Diagnosis not present

## 2019-12-01 DIAGNOSIS — Z87891 Personal history of nicotine dependence: Secondary | ICD-10-CM | POA: Diagnosis not present

## 2019-12-01 DIAGNOSIS — D500 Iron deficiency anemia secondary to blood loss (chronic): Secondary | ICD-10-CM

## 2019-12-01 MED ORDER — SODIUM CHLORIDE 0.9 % IV SOLN
Freq: Once | INTRAVENOUS | Status: AC
  Filled 2019-12-01: qty 250

## 2019-12-01 MED ORDER — FAMOTIDINE IN NACL 20-0.9 MG/50ML-% IV SOLN
20.0000 mg | Freq: Once | INTRAVENOUS | Status: AC
  Administered 2019-12-01: 20 mg via INTRAVENOUS

## 2019-12-01 MED ORDER — ACETAMINOPHEN 325 MG PO TABS
650.0000 mg | ORAL_TABLET | Freq: Once | ORAL | Status: AC
  Administered 2019-12-01: 650 mg via ORAL

## 2019-12-01 MED ORDER — ACETAMINOPHEN 325 MG PO TABS
ORAL_TABLET | ORAL | Status: AC
  Filled 2019-12-01: qty 2

## 2019-12-01 MED ORDER — DIPHENHYDRAMINE HCL 25 MG PO CAPS
ORAL_CAPSULE | ORAL | Status: AC
  Filled 2019-12-01: qty 2

## 2019-12-01 MED ORDER — DIPHENHYDRAMINE HCL 25 MG PO CAPS
50.0000 mg | ORAL_CAPSULE | Freq: Once | ORAL | Status: AC
  Administered 2019-12-01: 50 mg via ORAL

## 2019-12-01 MED ORDER — SODIUM CHLORIDE 0.9 % IV SOLN
200.0000 mg | Freq: Once | INTRAVENOUS | Status: AC
  Administered 2019-12-01: 200 mg via INTRAVENOUS
  Filled 2019-12-01: qty 200

## 2019-12-01 MED ORDER — FAMOTIDINE IN NACL 20-0.9 MG/50ML-% IV SOLN
INTRAVENOUS | Status: AC
  Filled 2019-12-01: qty 50

## 2019-12-07 ENCOUNTER — Other Ambulatory Visit: Payer: Self-pay

## 2019-12-07 ENCOUNTER — Inpatient Hospital Stay: Payer: 59

## 2019-12-07 VITALS — BP 129/69 | HR 61 | Temp 98.4°F | Resp 18

## 2019-12-07 DIAGNOSIS — D500 Iron deficiency anemia secondary to blood loss (chronic): Secondary | ICD-10-CM | POA: Diagnosis not present

## 2019-12-07 DIAGNOSIS — Z1151 Encounter for screening for human papillomavirus (HPV): Secondary | ICD-10-CM | POA: Diagnosis not present

## 2019-12-07 DIAGNOSIS — E119 Type 2 diabetes mellitus without complications: Secondary | ICD-10-CM | POA: Diagnosis not present

## 2019-12-07 DIAGNOSIS — D499 Neoplasm of unspecified behavior of unspecified site: Secondary | ICD-10-CM | POA: Diagnosis not present

## 2019-12-07 DIAGNOSIS — N920 Excessive and frequent menstruation with regular cycle: Secondary | ICD-10-CM | POA: Diagnosis not present

## 2019-12-07 DIAGNOSIS — Z794 Long term (current) use of insulin: Secondary | ICD-10-CM | POA: Diagnosis not present

## 2019-12-07 DIAGNOSIS — Z6841 Body Mass Index (BMI) 40.0 and over, adult: Secondary | ICD-10-CM | POA: Diagnosis not present

## 2019-12-07 DIAGNOSIS — Z87891 Personal history of nicotine dependence: Secondary | ICD-10-CM | POA: Diagnosis not present

## 2019-12-07 DIAGNOSIS — Z01419 Encounter for gynecological examination (general) (routine) without abnormal findings: Secondary | ICD-10-CM | POA: Diagnosis not present

## 2019-12-07 MED ORDER — DIPHENHYDRAMINE HCL 25 MG PO CAPS
ORAL_CAPSULE | ORAL | Status: AC
  Filled 2019-12-07: qty 2

## 2019-12-07 MED ORDER — ACETAMINOPHEN 325 MG PO TABS
ORAL_TABLET | ORAL | Status: AC
  Filled 2019-12-07: qty 2

## 2019-12-07 MED ORDER — SODIUM CHLORIDE 0.9 % IV SOLN
200.0000 mg | Freq: Once | INTRAVENOUS | Status: AC
  Administered 2019-12-07: 200 mg via INTRAVENOUS
  Filled 2019-12-07: qty 200

## 2019-12-07 MED ORDER — FAMOTIDINE IN NACL 20-0.9 MG/50ML-% IV SOLN
INTRAVENOUS | Status: AC
  Filled 2019-12-07: qty 50

## 2019-12-07 MED ORDER — SODIUM CHLORIDE 0.9 % IV SOLN
Freq: Once | INTRAVENOUS | Status: AC
  Filled 2019-12-07: qty 250

## 2019-12-07 MED ORDER — ACETAMINOPHEN 325 MG PO TABS
650.0000 mg | ORAL_TABLET | Freq: Once | ORAL | Status: AC
  Administered 2019-12-07: 650 mg via ORAL

## 2019-12-07 MED ORDER — DIPHENHYDRAMINE HCL 25 MG PO CAPS
50.0000 mg | ORAL_CAPSULE | Freq: Once | ORAL | Status: AC
  Administered 2019-12-07: 50 mg via ORAL

## 2019-12-07 MED ORDER — FAMOTIDINE IN NACL 20-0.9 MG/50ML-% IV SOLN
20.0000 mg | Freq: Once | INTRAVENOUS | Status: AC
  Administered 2019-12-07: 20 mg via INTRAVENOUS

## 2019-12-07 NOTE — Patient Instructions (Signed)

## 2019-12-08 ENCOUNTER — Other Ambulatory Visit: Payer: Self-pay

## 2019-12-08 ENCOUNTER — Inpatient Hospital Stay: Payer: 59

## 2019-12-08 VITALS — BP 144/87 | HR 71 | Temp 98.2°F | Resp 18

## 2019-12-08 DIAGNOSIS — E119 Type 2 diabetes mellitus without complications: Secondary | ICD-10-CM | POA: Diagnosis not present

## 2019-12-08 DIAGNOSIS — N920 Excessive and frequent menstruation with regular cycle: Secondary | ICD-10-CM | POA: Diagnosis not present

## 2019-12-08 DIAGNOSIS — Z794 Long term (current) use of insulin: Secondary | ICD-10-CM | POA: Diagnosis not present

## 2019-12-08 DIAGNOSIS — D499 Neoplasm of unspecified behavior of unspecified site: Secondary | ICD-10-CM | POA: Diagnosis not present

## 2019-12-08 DIAGNOSIS — D500 Iron deficiency anemia secondary to blood loss (chronic): Secondary | ICD-10-CM | POA: Diagnosis not present

## 2019-12-08 DIAGNOSIS — Z87891 Personal history of nicotine dependence: Secondary | ICD-10-CM | POA: Diagnosis not present

## 2019-12-08 MED ORDER — DIPHENHYDRAMINE HCL 25 MG PO CAPS
ORAL_CAPSULE | ORAL | Status: AC
  Filled 2019-12-08: qty 2

## 2019-12-08 MED ORDER — FAMOTIDINE IN NACL 20-0.9 MG/50ML-% IV SOLN
INTRAVENOUS | Status: AC
  Filled 2019-12-08: qty 50

## 2019-12-08 MED ORDER — DIPHENHYDRAMINE HCL 25 MG PO CAPS
50.0000 mg | ORAL_CAPSULE | Freq: Once | ORAL | Status: AC
  Administered 2019-12-08: 50 mg via ORAL

## 2019-12-08 MED ORDER — ACETAMINOPHEN 325 MG PO TABS
ORAL_TABLET | ORAL | Status: AC
  Filled 2019-12-08: qty 2

## 2019-12-08 MED ORDER — SODIUM CHLORIDE 0.9 % IV SOLN
200.0000 mg | Freq: Once | INTRAVENOUS | Status: AC
  Administered 2019-12-08: 200 mg via INTRAVENOUS
  Filled 2019-12-08: qty 10

## 2019-12-08 MED ORDER — ACETAMINOPHEN 325 MG PO TABS
650.0000 mg | ORAL_TABLET | Freq: Once | ORAL | Status: AC
  Administered 2019-12-08: 650 mg via ORAL

## 2019-12-08 MED ORDER — FAMOTIDINE IN NACL 20-0.9 MG/50ML-% IV SOLN
20.0000 mg | Freq: Once | INTRAVENOUS | Status: AC
  Administered 2019-12-08: 20 mg via INTRAVENOUS

## 2019-12-08 MED ORDER — SODIUM CHLORIDE 0.9 % IV SOLN
Freq: Once | INTRAVENOUS | Status: AC
  Filled 2019-12-08: qty 250

## 2019-12-08 NOTE — Progress Notes (Signed)
Patient declined to stay for 30 minute post-obs. Vital signs stable upon discharge.

## 2019-12-12 DIAGNOSIS — K8689 Other specified diseases of pancreas: Secondary | ICD-10-CM | POA: Diagnosis not present

## 2019-12-12 DIAGNOSIS — M25562 Pain in left knee: Secondary | ICD-10-CM | POA: Diagnosis not present

## 2019-12-12 DIAGNOSIS — D3A8 Other benign neuroendocrine tumors: Secondary | ICD-10-CM | POA: Diagnosis not present

## 2019-12-12 DIAGNOSIS — M6281 Muscle weakness (generalized): Secondary | ICD-10-CM | POA: Diagnosis not present

## 2019-12-12 DIAGNOSIS — E119 Type 2 diabetes mellitus without complications: Secondary | ICD-10-CM | POA: Diagnosis not present

## 2019-12-12 DIAGNOSIS — C7A8 Other malignant neuroendocrine tumors: Secondary | ICD-10-CM | POA: Diagnosis not present

## 2019-12-12 DIAGNOSIS — Z713 Dietary counseling and surveillance: Secondary | ICD-10-CM | POA: Diagnosis not present

## 2019-12-12 DIAGNOSIS — G8929 Other chronic pain: Secondary | ICD-10-CM | POA: Diagnosis not present

## 2019-12-12 DIAGNOSIS — I10 Essential (primary) hypertension: Secondary | ICD-10-CM | POA: Diagnosis not present

## 2019-12-12 DIAGNOSIS — Z7689 Persons encountering health services in other specified circumstances: Secondary | ICD-10-CM | POA: Diagnosis not present

## 2019-12-12 DIAGNOSIS — M25561 Pain in right knee: Secondary | ICD-10-CM | POA: Diagnosis not present

## 2019-12-13 ENCOUNTER — Telehealth: Payer: Self-pay

## 2019-12-13 MED FILL — JANUVIA 100 MG TABLET: 100 | 30 days supply | Qty: 30 | Fill #0

## 2019-12-13 NOTE — Telephone Encounter (Signed)
Left detailed message, needs to make appointment for diabetic eye exam.

## 2019-12-15 ENCOUNTER — Other Ambulatory Visit: Payer: Self-pay

## 2019-12-15 ENCOUNTER — Telehealth: Payer: Self-pay

## 2019-12-15 ENCOUNTER — Encounter: Payer: Self-pay | Admitting: Nurse Practitioner

## 2019-12-15 DIAGNOSIS — D500 Iron deficiency anemia secondary to blood loss (chronic): Secondary | ICD-10-CM

## 2019-12-15 NOTE — Telephone Encounter (Signed)
Per Cira Rue NP and patients request called Dr Renold Genta office [phone]) and let them know about labs ( CBC, IRON/TIBC, FERRITIN) being done for patient at their off on 12/19/19 and faxed over to [place] when done. Also gave them our fax number([phone]) to send results over when they are done on 12/19/19. Dr Verlene Mayer nurse verbalized understanding. Made Lacie aware that the office agreed to send over requested labs when done.

## 2019-12-15 NOTE — Progress Notes (Signed)
iron

## 2019-12-15 NOTE — Telephone Encounter (Signed)
Patient called and she is not able to make it to her lab appointment on 12/21/19. However she has an appointment with her PCP Tedra Senegal on 12/19/19 and was wondering if whatever labs Cira Rue NP needed from her can just be drawn there and faxed over. Sent in basket message to [facility] to see If she could  let me know which labs they are I can call them and make sure that they collect them there and fax them over  after.

## 2019-12-19 ENCOUNTER — Other Ambulatory Visit: Payer: 59 | Admitting: Internal Medicine

## 2019-12-19 ENCOUNTER — Other Ambulatory Visit: Payer: Self-pay

## 2019-12-19 DIAGNOSIS — D500 Iron deficiency anemia secondary to blood loss (chronic): Secondary | ICD-10-CM | POA: Diagnosis not present

## 2019-12-19 DIAGNOSIS — E1122 Type 2 diabetes mellitus with diabetic chronic kidney disease: Secondary | ICD-10-CM | POA: Diagnosis not present

## 2019-12-19 NOTE — Addendum Note (Signed)
Addended by: Mady Haagensen on: 12/19/2019 09:09 AM   Modules accepted: Orders

## 2019-12-20 ENCOUNTER — Encounter: Payer: Self-pay | Admitting: Nurse Practitioner

## 2019-12-20 LAB — IRON, TOTAL/TOTAL IRON BINDING CAP
%SAT: 14 % (calc) — ABNORMAL LOW (ref 16–45)
Iron: 42 ug/dL (ref 40–190)
TIBC: 293 mcg/dL (calc) (ref 250–450)

## 2019-12-20 LAB — CBC WITH DIFFERENTIAL/PLATELET
Absolute Monocytes: 415 cells/uL (ref 200–950)
Basophils Absolute: 20 cells/uL (ref 0–200)
Basophils Relative: 0.3 %
Eosinophils Absolute: 27 cells/uL (ref 15–500)
Eosinophils Relative: 0.4 %
HCT: 37.7 % (ref 35.0–45.0)
Hemoglobin: 12.3 g/dL (ref 11.7–15.5)
Lymphs Abs: 1972 cells/uL (ref 850–3900)
MCH: 24.2 pg — ABNORMAL LOW (ref 27.0–33.0)
MCHC: 32.6 g/dL (ref 32.0–36.0)
MCV: 74.1 fL — ABNORMAL LOW (ref 80.0–100.0)
MPV: 11.4 fL (ref 7.5–12.5)
Monocytes Relative: 6.1 %
Neutro Abs: 4366 cells/uL (ref 1500–7800)
Neutrophils Relative %: 64.2 %
Platelets: 285 10*3/uL (ref 140–400)
RBC: 5.09 10*6/uL (ref 3.80–5.10)
RDW: 17.7 % — ABNORMAL HIGH (ref 11.0–15.0)
Total Lymphocyte: 29 %
WBC: 6.8 10*3/uL (ref 3.8–10.8)

## 2019-12-20 LAB — FERRITIN: Ferritin: 502 ng/mL — ABNORMAL HIGH (ref 16–154)

## 2019-12-20 LAB — HEMOGLOBIN A1C
Hgb A1c MFr Bld: 10.2 % of total Hgb — ABNORMAL HIGH (ref <5.7)
Mean Plasma Glucose: 246 (calc)
eAG (mmol/L): 13.6 (calc)

## 2019-12-21 ENCOUNTER — Ambulatory Visit (INDEPENDENT_AMBULATORY_CARE_PROVIDER_SITE_OTHER): Payer: 59 | Admitting: Internal Medicine

## 2019-12-21 ENCOUNTER — Ambulatory Visit
Admission: RE | Admit: 2019-12-21 | Discharge: 2019-12-21 | Disposition: A | Discharge status: Home / Self Care | Payer: 59 | Source: Ambulatory Visit | Attending: Internal Medicine | Admitting: Internal Medicine

## 2019-12-21 ENCOUNTER — Other Ambulatory Visit: Payer: Self-pay

## 2019-12-21 ENCOUNTER — Inpatient Hospital Stay: Payer: 59

## 2019-12-21 ENCOUNTER — Encounter: Payer: Self-pay | Admitting: Internal Medicine

## 2019-12-21 VITALS — BP 130/90 | HR 95 | Ht 65.0 in | Wt 282.0 lb

## 2019-12-21 DIAGNOSIS — C7A8 Other malignant neuroendocrine tumors: Secondary | ICD-10-CM

## 2019-12-21 DIAGNOSIS — R110 Nausea: Secondary | ICD-10-CM

## 2019-12-21 DIAGNOSIS — K5900 Constipation, unspecified: Secondary | ICD-10-CM | POA: Diagnosis not present

## 2019-12-21 DIAGNOSIS — I10 Essential (primary) hypertension: Secondary | ICD-10-CM | POA: Diagnosis not present

## 2019-12-21 DIAGNOSIS — Z862 Personal history of diseases of the blood and blood-forming organs and certain disorders involving the immune mechanism: Secondary | ICD-10-CM | POA: Diagnosis not present

## 2019-12-21 DIAGNOSIS — E1165 Type 2 diabetes mellitus with hyperglycemia: Secondary | ICD-10-CM

## 2019-12-21 NOTE — Progress Notes (Signed)
   Subjective:    Patient ID: Dawn Young, female    DOB: 1981/04/25, 39 y.o.   MRN: [id number]  HPI 39 year old Black Female with history of neuroendocrine tumor of pancreas being followed at [facility]. Also has diabetes mellitus and morbid obesity. Is being considered for gastric by-pass surgery at [facility].Currnetly on Januvia 100 mg daily. In April was placed on sliding scale insulin as Hgb AIC was 12.4%.I feel it is best that she see Endocrinologist for follow up given diabetes is new onset and with hx of neuroendocrine tumor. Hgb AIC better but elevated at 10.2%. Diabetes was diagnosed as new onset April 2021.  Recently, she felt nauseated with vague upper abdominal pain for 2-3 days. Is improving. She has some diarrhea and nausea. May have had viral gastroentertitis. Felt some obstipation. No BM today. Sent for KUB. Advise clear liquids and advance diet slowly. May try Dulcolax suppository. KUB shows no signs of obstruction or constipation.  Has been receiving iron infusions at Hematology for iron deficiency anemia. Hgb improved from 10.5 grams in April to 12.3 grams May 25. MCV remains low likely due to thalassemia trait.Iron level improved from 27 when tested 4 weeks ago to 42.  Hematology wants me to follow up in 2-3 months    Review of Systems no other complaints today.     Objective:   Physical Exam  BP 130/90 pulse 95 pulse ox 98% Weight 282 pounds BMI 46.93  Abdomen obese soft nondistended without hepatosplenomegaly. Nontender with rebound tenderness.      Assessment & Plan:  Poorly controlled diabetes mellitus-referral to Dr. Dwyane Dee for consultation  Pancreatic neuroendocrine tumor being followed at [facility]-being considered for gastric bypass surgery at [facility]  History of iron deficiency status post iron infusions by hematology.  Now will be on oral iron supplement and follow-up will be in approximately 2 months  Nausea and obstipation-KUB shows no bowel  obstruction.  Could have had viral syndrome.  Advance diet slowly.  If feels bloated may try Dulcolax suppository one time.  Hypertension treated with amlodipine and stable

## 2019-12-22 NOTE — Patient Instructions (Signed)
Continue iron sulfate orally. Referral to Dr. Dwyane Dee for diabetes management. Follow up here in 2-3 months as recommended by Hematology

## 2020-02-06 ENCOUNTER — Other Ambulatory Visit: Payer: Self-pay

## 2020-02-06 ENCOUNTER — Ambulatory Visit (INDEPENDENT_AMBULATORY_CARE_PROVIDER_SITE_OTHER): Payer: 59 | Admitting: Internal Medicine

## 2020-02-06 ENCOUNTER — Encounter: Payer: Self-pay | Admitting: Internal Medicine

## 2020-02-06 VITALS — BP 120/90 | HR 62 | Ht 65.0 in | Wt 282.0 lb

## 2020-02-06 DIAGNOSIS — E1165 Type 2 diabetes mellitus with hyperglycemia: Secondary | ICD-10-CM

## 2020-02-06 DIAGNOSIS — Z862 Personal history of diseases of the blood and blood-forming organs and certain disorders involving the immune mechanism: Secondary | ICD-10-CM

## 2020-02-06 DIAGNOSIS — I10 Essential (primary) hypertension: Secondary | ICD-10-CM | POA: Diagnosis not present

## 2020-02-06 DIAGNOSIS — D3A8 Other benign neuroendocrine tumors: Secondary | ICD-10-CM

## 2020-02-06 DIAGNOSIS — E119 Type 2 diabetes mellitus without complications: Secondary | ICD-10-CM | POA: Diagnosis not present

## 2020-02-06 MED ORDER — SITAGLIPTIN PHOSPHATE 100 MG PO TABS: 100.0000 mg | ORAL_TABLET | Freq: Every day | ORAL | 2 refills | Status: DC

## 2020-02-06 MED ORDER — FREESTYLE LITE TEST VI STRP: 1.0000 | ORAL_STRIP | Freq: Four times a day (QID) | 11 refills | Status: DC

## 2020-02-06 MED FILL — JANUVIA 100 MG TABLET: 100 | 30 days supply | Qty: 30 | Fill #0

## 2020-02-06 MED FILL — FREESTYLE LITE TEST STRIP: 25 days supply | Qty: 100 | Fill #0

## 2020-02-06 NOTE — Progress Notes (Signed)
   Subjective:    Patient ID: Dawn Young, female    DOB: Jun 09, 1981, 39 y.o.   MRN: [id number]  HPI  39 year old Female morbidly obesity, diabetes mellitus and pancreatic neuroendocrine tumor being followed at [facility] after having careful evaluation at [facility]. Is being considered for gastric sleeve surgery at [facility].  Patient feels that she needs some help from dietitian regarding food choices.  When she is working it is more difficult for her to prepare correct types of food.  Feels stressed by this.  Was diagnosed with new onset diabetes mellitus in early April.  Hemoglobin A1c was 12.4%.  Fasting glucose was 436.  Was also found to have iron deficiency anemia and was referred to hematology for iron infusion.  She was started on Januvia and sliding scale insulin.  In May her hemoglobin A1c was 10.2%.  It is now 11.2%.  Iron and iron binding capacity are now within normal limits but ferritin is 208  Patient had breast reduction surgery at [facility] in 2018.         Review of Systems see above     Objective:   Physical Exam  Blood pressure 120/90, pulse 62 pulse oximetry 98% weight 218 pounds BMI 46.93  Neck is supple.  Chest clear to auscultation.  Cardiac exam regular rate and rhythm.      Assessment & Plan:  Poorly controlled diabetes mellitus which had new onset Spring 2021 currently being treated with Januvia and was initially treated with sliding scale insulin in April.  She may continue with sliding scale insulin.  Hemoglobin A1c 11.2%.  Being referred to Endocrinology  Morbid obesity-being considered for gastric sleeve surgery at [place] hypertension treated with amlodipine 5 mg daily  History of iron deficiency anemia with history of poor tolerance of oral iron medications.  Was given IV Feraheme in late April  Pancreatic duct stricture/neuroendocrine tumor being followed at [facility]: Endocrinology referral for diabetic management.   Needs intense nutritional counseling and help with meal planning.

## 2020-02-06 NOTE — Patient Instructions (Addendum)
Labs drawn and pending.  Referral to endocrinology for diabetic management and referral to dietitian.  Keep appointment with Hematology in October for iron deficiency anemia.  Continue sliding scale insulin and Januvia until endocrinology appointment.

## 2020-02-07 LAB — IRON,TIBC AND FERRITIN PANEL
%SAT: 16 % (calc) (ref 16–45)
Ferritin: 208 ng/mL — ABNORMAL HIGH (ref 16–154)
Iron: 48 ug/dL (ref 40–190)
TIBC: 302 mcg/dL (calc) (ref 250–450)

## 2020-02-07 LAB — HEMOGLOBIN A1C
Hgb A1c MFr Bld: 11.2 % of total Hgb — ABNORMAL HIGH (ref <5.7)
Mean Plasma Glucose: 275 (calc)
eAG (mmol/L): 15.2 (calc)

## 2020-02-20 DIAGNOSIS — G4719 Other hypersomnia: Secondary | ICD-10-CM | POA: Diagnosis not present

## 2020-02-20 DIAGNOSIS — Z6841 Body Mass Index (BMI) 40.0 and over, adult: Secondary | ICD-10-CM | POA: Diagnosis not present

## 2020-02-26 ENCOUNTER — Telehealth: Payer: 59 | Admitting: Emergency Medicine

## 2020-02-26 ENCOUNTER — Other Ambulatory Visit: Payer: Self-pay

## 2020-02-26 ENCOUNTER — Ambulatory Visit (INDEPENDENT_AMBULATORY_CARE_PROVIDER_SITE_OTHER): Payer: 59 | Admitting: Endocrinology

## 2020-02-26 ENCOUNTER — Encounter: Payer: Self-pay | Admitting: Endocrinology

## 2020-02-26 VITALS — BP 124/90 | HR 65 | Ht 65.0 in | Wt 275.2 lb

## 2020-02-26 DIAGNOSIS — E1165 Type 2 diabetes mellitus with hyperglycemia: Secondary | ICD-10-CM

## 2020-02-26 DIAGNOSIS — L83 Acanthosis nigricans: Secondary | ICD-10-CM | POA: Diagnosis not present

## 2020-02-26 DIAGNOSIS — N760 Acute vaginitis: Secondary | ICD-10-CM | POA: Diagnosis not present

## 2020-02-26 DIAGNOSIS — I10 Essential (primary) hypertension: Secondary | ICD-10-CM

## 2020-02-26 MED ORDER — FLUCONAZOLE 150 MG PO TABS
150.0000 mg | ORAL_TABLET | Freq: Once | ORAL | 0 refills | Status: AC
Start: 2020-02-26 — End: 2020-02-26

## 2020-02-26 MED ORDER — OZEMPIC (0.25 OR 0.5 MG/DOSE) 2 MG/1.5ML ~~LOC~~ SOPN: 0.5000 mg | PEN_INJECTOR | SUBCUTANEOUS | 2 refills | Status: DC

## 2020-02-26 MED ORDER — METFORMIN HCL ER 500 MG PO TB24: 2000.0000 mg | ORAL_TABLET | Freq: Every day | ORAL | 3 refills | Status: DC

## 2020-02-26 MED FILL — metFORMIN HCL ER 500 MG TB2: 500 | 30 days supply | Qty: 120 | Fill #0

## 2020-02-26 MED FILL — FLUCONAZOLE 150 MG TABS: 150 | 1 days supply | Qty: 1 | Fill #0

## 2020-02-26 NOTE — Progress Notes (Signed)
Patient ID: Dawn Young, female   DOB: 07-03-1981, 39 y.o.   MRN: [id number]           Reason for Appointment: Consultation for Type 2 Diabetes  Referring PCP: Dr. Tedra Senegal   History of Present Illness:          Date of diagnosis of type 2 diabetes mellitus: 3/21       Background history:  She had a random glucose of 175 in 2020 but her diagnosis was not made until 3/21 At that time she went to the urgent care center because of vaginal discharge and itching and was found to have glycosuria and blood sugar over 200 Lab glucose subsequently was 436 and baseline A1c 12.4   Recent history:   Most recent A1c is 11.2 done on 02/06/2020  INSULIN regimen is:  NPH as needed 3-10  units     Non-insulin hypoglycemic drugs the patient is taking are: Januvia 100 mg daily  Current management, blood sugar patterns and problems identified:  She continues to have blood sugars averaging over 300  Appears that she is checking her blood sugars somewhat intermittently and started back on 7/25  Most likely is taking only 3 to 7 units of insulin once or twice a day without any benefit  She has not tried any other non-insulin medications such as Metformin  She has not had any excessive thirst recently but she thinks she is starting to feel a yeast infection again  She is trying a little to make lower fat meals and less carbohydrates but is wanting to have more information on meal planning  She has lost 7 pounds since last month apparently but overall her weight stays about the same          Side effects from medications have been: None     Meal times are:  Breakfast is at 8 am Lunch: 2 pm  Dinner: 7-8 pm    Typical meal intake: Breakfast is eggs and bacon, lunch [place] burger, dinner salad with fish               Exercise: Walking on treadmill  15-30 min almost daily  Glucose monitoring:  done 1-2 times a day         Glucometer:   Freestyle lite    Blood Glucose readings by time  of day and averages from meter download:  PREMEAL Breakfast Lunch Dinner Bedtime  Overall   Glucose range:  247-369  253-381   458,488   Median:      339    Dietician visit, most recent: None  Weight history: Previous range 260-287  Wt Readings from Last 3 Encounters:  02/26/20 275 lb 3.2 oz (124.8 kg)  02/06/20 282 lb (127.9 kg)  12/21/19 282 lb (127.9 kg)    Glycemic control:   Lab Results  Component Value Date   HGBA1C 11.2 (H) 02/06/2020   HGBA1C 10.2 (H) 12/19/2019   HGBA1C 12.4 (H) 10/31/2019   Lab Results  Component Value Date   LDLCALC 88 11/04/2017   CREATININE 0.77 10/31/2019   No results found for: MICRALBCREAT  No results found for: FRUCTOSAMINE  No visits with results within 1 Week(s) from this visit.  Latest known visit with results is:  Office Visit on 02/06/2020  Component Date Value Ref Range Status  . Hgb A1c MFr Bld 02/06/2020 11.2* <5.7 % of total Hgb Final   Comment: For someone without known diabetes, a hemoglobin A1c value of  6.5% or greater indicates that they may have  diabetes and this should be confirmed with a follow-up  test. . For someone with known diabetes, a value <7% indicates  that their diabetes is well controlled and a value  greater than or equal to 7% indicates suboptimal  control. A1c targets should be individualized based on  duration of diabetes, age, comorbid conditions, and  other considerations. . Currently, no consensus exists regarding use of hemoglobin A1c for diagnosis of diabetes for children. .   . Mean Plasma Glucose 02/06/2020 275  (calc) Final  . eAG (mmol/L) 02/06/2020 15.2  (calc) Final  . Iron 02/06/2020 48  40 - 190 mcg/dL Final  . TIBC 02/06/2020 302  250 - 450 mcg/dL (calc) Final  . %SAT 02/06/2020 16  16 - 45 % (calc) Final  . Ferritin 02/06/2020 208* 16 - 154 ng/mL Final    Allergies as of 02/26/2020   No Known Allergies     Medication List       Accurate as of February 26, 2020  4:24 PM. If  you have any questions, ask your nurse or doctor.        amLODipine 5 MG tablet Commonly known as: NORVASC Take 1 tablet (5 mg total) by mouth daily.   fluconazole 150 MG tablet Commonly known as: DIFLUCAN Take 1 tablet (150 mg total) by mouth once for 1 dose. Started by: Margarita Mail, PA-C   freestyle lancets SMARTSIG:1 Each Topical 4 Times Daily   FREESTYLE LITE test strip Generic drug: glucose blood 1 each by Other route 4 (four) times daily.   insulin NPH Human 100 UNIT/ML injection Commonly known as: HumuLIN N Follow guidelines of sliding scale   metFORMIN 500 MG 24 hr tablet Commonly known as: GLUCOPHAGE-XR Take 4 tablets (2,000 mg total) by mouth daily with supper. Started by: Elayne Snare, MD   Ozempic (0.25 or 0.5 MG/DOSE) 2 MG/1.5ML Sopn Generic drug: Semaglutide(0.25 or 0.5MG /DOS) Inject 0.375 mLs (0.5 mg total) into the skin once a week. Started by: Elayne Snare, MD   sitaGLIPtin 100 MG tablet Commonly known as: Januvia Take 1 tablet (100 mg total) by mouth daily.       Allergies: No Known Allergies  Past Medical History:  Diagnosis Date  . History of kidney stones   . Hx of migraine headaches   . Hx of migraines     Past Surgical History:  Procedure Laterality Date  . BREAST REDUCTION SURGERY Bilateral 06/24/2017   Procedure: MAMMARY REDUCTION  (BREAST);  Surgeon: Crissie Reese, MD;  Location: [place];  Service: Plastics;  Laterality: Bilateral;  . BREAST SURGERY Bilateral 05/2017  . CESAREAN SECTION  03, 05, 06   x's 3  . CHOLECYSTECTOMY  2007  . ESOPHAGOGASTRODUODENOSCOPY N/A 03/02/2019   Procedure: ESOPHAGOGASTRODUODENOSCOPY (EGD);  Surgeon: Milus Banister, MD;  Location: [facility];  Service: Endoscopy;  Laterality: N/A;  . EUS N/A 03/02/2019   Procedure: UPPER ENDOSCOPIC ULTRASOUND (EUS) RADIAL;  Surgeon: Milus Banister, MD;  Location: [facility];  Service: Endoscopy;  Laterality: N/A;  . FINE NEEDLE ASPIRATION N/A 03/02/2019   Procedure:  FINE NEEDLE ASPIRATION (FNA) LINEAR;  Surgeon: Milus Banister, MD;  Location: [facility];  Service: Endoscopy;  Laterality: N/A;  . TUBAL LIGATION  2006    Family History  Problem Relation Age of Onset  . Healthy Mother   . Healthy Father   . Gout Father   . Healthy Maternal Grandmother   . Healthy Maternal  Grandfather   . Healthy Paternal Grandmother   . Healthy Paternal Grandfather   . Colon cancer Neg Hx   . Esophageal cancer Neg Hx   . Diabetes Neg Hx     Social History:  reports that she has quit smoking. Her smoking use included cigarettes. She has a 0.50 pack-year smoking history. She has never used smokeless tobacco. She reports current alcohol use. She reports that she does not use drugs.   Review of Systems  Constitutional: Negative for weight loss and weight gain.  HENT: Negative for headaches.   Eyes: Negative for blurred vision.  Respiratory: Negative for shortness of breath.   Cardiovascular: Negative for leg swelling.  Gastrointestinal: Negative for constipation and diarrhea.  Endocrine: Positive for fatigue.       Menstrual cycles are heavy and prolonged, usually every month  Musculoskeletal: Negative for joint pain.  Neurological: Negative for numbness.     Lipid history: No previous history of hypercholesterolemia    Lab Results  Component Value Date   CHOL 141 11/04/2017   HDL 37 (L) 11/04/2017   LDLCALC 88 11/04/2017   TRIG 70 11/04/2017   CHOLHDL 3.8 11/04/2017           Hypertension: Has been treated with amlodipine alone  BP Readings from Last 3 Encounters:  02/26/20 124/90  02/06/20 120/90  12/21/19 130/90     Most recent foot exam: 8/21  Currently known complications of diabetes: None  LABS:  No visits with results within 1 Week(s) from this visit.  Latest known visit with results is:  Office Visit on 02/06/2020  Component Date Value Ref Range Status  . Hgb A1c MFr Bld 02/06/2020 11.2* <5.7 % of total Hgb Final   Comment:  For someone without known diabetes, a hemoglobin A1c value of 6.5% or greater indicates that they may have  diabetes and this should be confirmed with a follow-up  test. . For someone with known diabetes, a value <7% indicates  that their diabetes is well controlled and a value  greater than or equal to 7% indicates suboptimal  control. A1c targets should be individualized based on  duration of diabetes, age, comorbid conditions, and  other considerations. . Currently, no consensus exists regarding use of hemoglobin A1c for diagnosis of diabetes for children. .   . Mean Plasma Glucose 02/06/2020 275  (calc) Final  . eAG (mmol/L) 02/06/2020 15.2  (calc) Final  . Iron 02/06/2020 48  40 - 190 mcg/dL Final  . TIBC 02/06/2020 302  250 - 450 mcg/dL (calc) Final  . %SAT 02/06/2020 16  16 - 45 % (calc) Final  . Ferritin 02/06/2020 208* 16 - 154 ng/mL Final    Physical Examination:  BP 124/90 (BP Location: Left Arm, Patient Position: Sitting, Cuff Size: Large)   Pulse 65   Ht 5\' 5"  (1.651 m)   Wt 275 lb 3.2 oz (124.8 kg)   SpO2 99%   BMI 45.80 kg/m   GENERAL:         Patient has marked generalized obesity.    HEENT:         Eye exam shows normal external appearance.  Fundus exam shows no retinopathy.  Oral exam deferred   NECK:  She has marked pigmentation around the neck in the lower part There is no lymphadenopathy  Thyroid is not enlarged and no nodules felt.    LUNGS:         Chest is symmetrical.  Lungs are clear to  auscultation.Marland Kitchen    HEART:         Heart sounds:  S1 and S2 are normal. No murmur or click heard., no S3 or S4.    ABDOMEN:   There is no distention present. Liver and spleen are not palpable.  No other mass or tenderness present.    NEUROLOGICAL:   Ankle jerks are absent bilaterally.   Biceps reflexes are 1+  Diabetic Foot Exam - Simple   Simple Foot Form Diabetic Foot exam was performed with the following findings: Yes   Visual Inspection No  deformities, no ulcerations, no other skin breakdown bilaterally: Yes Sensation Testing Intact to touch and monofilament testing bilaterally: Yes Pulse Check Posterior Tibialis and Dorsalis pulse intact bilaterally: Yes Comments             Vibration sense is nearly normal in distal first toes.  MUSCULOSKELETAL:  There is no swelling or deformity of the peripheral joints.     EXTREMITIES:     There is no ankle edema.  SKIN:       No rash or lesions of concern.        ASSESSMENT:  Diabetes type 2  See history of present illness for detailed discussion of current diabetes management, blood sugar patterns and problems identified  Most recent A1c is 11  Current treatment regimen is Januvia and low-dose NPH insulin  She has had significant hyperglycemia for at least 4 months now Although she is relatively insulin deficient with blood sugars occasionally over 400 she has recent onset diabetes with significant insulin resistance. Her diet is reasonable although she can do better with meal planning after advice from nutritionist Has been trying to exercise with walking almost daily Has slightly significant insulin resistance as seen by her acanthosis  HYPERTENSION: Diastolic blood pressure appears to be about 90 with current regimen of amlodipine alone  PLAN:    . Glucose monitoring: Patient advised to check readings on her meter regularly either fasting or 2 hours after meals Discussed blood sugar targets fasting and after meals  . Diabetes education: Patient will need counseling from nutritionist and will refer her for meal planning  . Lifestyle changes: Dietary changes: Avoid high-fat meals and snacks  Exercise regimen: Continue daily walking on treadmill  . Therapy changes: Stop Januvia and start Ozempic  She will have easier compliance with once weekly injection instead of daily Rybelsus in the mornings because of her work and breakfast schedule  Explained to the  patient what GLP-1 drugs are, the sites of actions and the body, reduction of hunger sensation and improved insulin secretion.  Discussed the benefit of weight loss with these medications. Explained possible side effects of OZEMPIC, most commonly nausea that usually improves over time.  Also explained safety information associated with the medication Demonstrated the medication injection device and injection technique to the patient.  To start the injections with the 0.25 mg dosage weekly for the first 4 injections and then go up to 0.5 mg weekly if no continued nausea  Patient brochure on Ozempic with enclosed co-pay card and also starting sample given  Start METFORMIN for insulin resistance She will start the Metformin ER 500 mg daily for the first 5 to 7 days and add 1 more tablet weekly until maximum tolerated dose or 4 tablets reached. Patient information on Metformin given  INSULIN: She will take 8 units of NPH at bedtime daily for now and stop when blood sugars in the mornings are below 150  Consider adding ACE inhibitor for better blood pressure control and long-term benefits  Needs screening lipids even though she previously has not had any hyperlipidemia, ordered  Follow-up: 1 month    Patient Instructions  Check blood sugars on waking up 3-4 days a week  Also check blood sugars about 2 hours after meals and do this after different meals by rotation  Recommended blood sugar levels on waking up are 90-130 and about 2 hours after meal is 130-160  Please bring your blood sugar monitor to each visit, thank you  Start taking Metformin 500 mg, 1 tablet with your main meal for 5-7 days.  Occasionally this may initially cause loose stools or nausea.  If  tolerating well after 5 days add a second Metformin tablet (500 mg) at the same time. Continue adding another tablet after 5 days days if no persistent nausea or diarrhea until reaching the maximum tolerated dose or the full dose of  4 tablets once daily.  Start OZEMPIC injections by dialing 0.25 mg on the pen as shown once weekly on the same day of the week.   You may inject in the sides of the stomach, outer thigh or arm as indicated in the brochure given. If you have any difficulties using the pen see the video at [employer].za  You will feel fullness of the stomach with starting the medication and should try to keep the portions at meals small.  You may experience nausea in the first few days which usually gets better over time    After 4 weeks increase the dose to 0.5 mg weekly  If you have any questions or persistent side effects please call the office   You may also talk to a nurse educator with [employer] at [phone] Useful website: [email or web address]   Take 8 units on N insulin at bedtime until am sugar is < 150 then stop       Consultation note has been sent to the referring physician  Elayne Snare 02/26/2020, 4:24 PM   Note: This office note was prepared with Dragon voice recognition system technology. Any transcriptional errors that result from this process are unintentional.

## 2020-02-26 NOTE — Patient Instructions (Signed)
Check blood sugars on waking up 3-4 days a week  Also check blood sugars about 2 hours after meals and do this after different meals by rotation  Recommended blood sugar levels on waking up are 90-130 and about 2 hours after meal is 130-160  Please bring your blood sugar monitor to each visit, thank you  Start taking Metformin 500 mg, 1 tablet with your main meal for 5-7 days.  Occasionally this may initially cause loose stools or nausea.  If  tolerating well after 5 days add a second Metformin tablet (500 mg) at the same time. Continue adding another tablet after 5 days days if no persistent nausea or diarrhea until reaching the maximum tolerated dose or the full dose of 4 tablets once daily.  Start OZEMPIC injections by dialing 0.25 mg on the pen as shown once weekly on the same day of the week.   You may inject in the sides of the stomach, outer thigh or arm as indicated in the brochure given. If you have any difficulties using the pen see the video at [employer]  You will feel fullness of the stomach with starting the medication and should try to keep the portions at meals small.  You may experience nausea in the first few days which usually gets better over time    After 4 weeks increase the dose to 0.5 mg weekly  If you have any questions or persistent side effects please call the office   You may also talk to a nurse educator with [employer] at [phone] Useful website: [email or web address]   Take 8 units on N insulin at bedtime until am sugar is < 150 then stop

## 2020-02-26 NOTE — Progress Notes (Signed)
We are sorry that you are not feeling well. Here is how we plan to help! Based on what you shared with me it looks like you: May have a yeast vaginosis  Vaginosis is an inflammation of the vagina that can result in discharge, itching and pain. The cause is usually a change in the normal balance of vaginal bacteria or an infection. Vaginosis can also result from reduced estrogen levels after menopause.  The most common causes of vaginosis are:   Bacterial vaginosis which results from an overgrowth of one on several organisms that are normally present in your vagina.   Yeast infections which are caused by a naturally occurring fungus called candida.   Vaginal atrophy (atrophic vaginosis) which results from the thinning of the vagina from reduced estrogen levels after menopause.   Trichomoniasis which is caused by a parasite and is commonly transmitted by sexual intercourse.  Factors that increase your risk of developing vaginosis include: . Medications, such as antibiotics and steroids . Uncontrolled diabetes . Use of hygiene products such as bubble bath, vaginal spray or vaginal deodorant . Douching . Wearing damp or tight-fitting clothing . Using an intrauterine device (IUD) for birth control . Hormonal changes, such as those associated with pregnancy, birth control pills or menopause . Sexual activity . Having a sexually transmitted infection  Your treatment plan is A single Diflucan (fluconazole) 150mg tablet once.  I have electronically sent this prescription into the pharmacy that you have chosen.  Be sure to take all of the medication as directed. Stop taking any medication if you develop a rash, tongue swelling or shortness of breath. Mothers who are breast feeding should consider pumping and discarding their breast milk while on these antibiotics. However, there is no consensus that infant exposure at these doses would be harmful.  Remember that medication creams can weaken latex  condoms. .   HOME CARE:  Good hygiene may prevent some types of vaginosis from recurring and may relieve some symptoms:  . Avoid baths, hot tubs and whirlpool spas. Rinse soap from your outer genital area after a shower, and dry the area well to prevent irritation. Don't use scented or harsh soaps, such as those with deodorant or antibacterial action. . Avoid irritants. These include scented tampons and pads. . Wipe from front to back after using the toilet. Doing so avoids spreading fecal bacteria to your vagina.  Other things that may help prevent vaginosis include:  . Don't douche. Your vagina doesn't require cleansing other than normal bathing. Repetitive douching disrupts the normal organisms that reside in the vagina and can actually increase your risk of vaginal infection. Douching won't clear up a vaginal infection. . Use a latex condom. Both female and female latex condoms may help you avoid infections spread by sexual contact. . Wear cotton underwear. Also wear pantyhose with a cotton crotch. If you feel comfortable without it, skip wearing underwear to bed. Yeast thrives in moist environments Your symptoms should improve in the next day or two.  GET HELP RIGHT AWAY IF:  . You have pain in your lower abdomen ( pelvic area or over your ovaries) . You develop nausea or vomiting . You develop a fever . Your discharge changes or worsens . You have persistent pain with intercourse . You develop shortness of breath, a rapid pulse, or you faint.  These symptoms could be signs of problems or infections that need to be evaluated by a medical provider now.  MAKE SURE YOU      Understand these instructions.  Will watch your condition.  Will get help right away if you are not doing well or get worse.  Your e-visit answers were reviewed by a board certified advanced clinical practitioner to complete your personal care plan. Depending upon the condition, your plan could have included  both over the counter or prescription medications. Please review your pharmacy choice to make sure that you have choses a pharmacy that is open for you to pick up any needed prescription, Your safety is important to us. If you have drug allergies check your prescription carefully.   You can use MyChart to ask questions about today's visit, request a non-urgent call back, or ask for a work or school excuse for 24 hours related to this e-Visit. If it has been greater than 24 hours you will need to follow up with your provider, or enter a new e-Visit to address those concerns. You will get a MyChart message within the next two days asking about your experience. I hope that your e-visit has been valuable and will speed your recovery.  **Please do not respond to this message unless you have follow up questions.** Greater than 5 but less than 10 minutes spent researching, coordinating, and implementing care for this patient today  

## 2020-03-12 ENCOUNTER — Other Ambulatory Visit: Payer: Self-pay

## 2020-03-12 ENCOUNTER — Encounter: Payer: Self-pay | Admitting: Skilled Nursing Facility1

## 2020-03-12 ENCOUNTER — Encounter: Payer: 59 | Attending: Endocrinology | Admitting: Skilled Nursing Facility1

## 2020-03-12 DIAGNOSIS — E119 Type 2 diabetes mellitus without complications: Secondary | ICD-10-CM | POA: Diagnosis not present

## 2020-03-12 NOTE — Progress Notes (Signed)
Patient was seen on 03/12/2020 for the first of a series of three diabetes self-management courses at the [facility].  Patient Education Plan per assessed needs and concerns is to attend three course education program for Diabetes Self Management Education.   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   The following learning objectives were met by the patient during this class:  Describe diabetes, types of diabetes and pathophysiology  State some common risk factors for diabetes  Defines the role of glucose and insulin  Describe the relationship between diabetes and cardiovascular and other risks  State the members of the Healthcare Team  States the rationale for glucose monitoring and when to test  State their individual [place] the importance of logging glucose readings and how to interpret the readings  Identifies A1C target  Explain the correlation between A1c and eAG values  State symptoms and treatment of high blood glucose and low blood glucose  Explain proper technique for glucose testing and identify proper sharps disposal  Handouts given during class include:  How to Thrive:  A Guide for Your Journey with Diabetes by the ADA  Meal Plan Card and carbohydrate content list  Dietary intake form  Low Sodium Flavoring Tips  Types of Fats  Dining Out  Label reading  Snack list  Planning a balanced meal  The diabetes portion plate  Diabetes Resources  A1c to eAG Conversion Chart  Blood Glucose Log  Diabetes Recommended Care Schedule  Support Group  Diabetes Success Plan  Core Class Satisfaction Survey   Follow-Up Plan:  Attend core 2

## 2020-03-19 ENCOUNTER — Other Ambulatory Visit: Payer: Self-pay

## 2020-03-19 ENCOUNTER — Other Ambulatory Visit (INDEPENDENT_AMBULATORY_CARE_PROVIDER_SITE_OTHER): Payer: 59

## 2020-03-19 DIAGNOSIS — E1165 Type 2 diabetes mellitus with hyperglycemia: Secondary | ICD-10-CM | POA: Diagnosis not present

## 2020-03-19 LAB — LIPID PANEL
Cholesterol: 146 mg/dL (ref 0–200)
HDL: 39.7 mg/dL
LDL Cholesterol: 80 mg/dL (ref 0–99)
NonHDL: 106.01
Total CHOL/HDL Ratio: 4
Triglycerides: 132 mg/dL (ref 0.0–149.0)
VLDL: 26.4 mg/dL (ref 0.0–40.0)

## 2020-03-19 LAB — GLUCOSE, RANDOM: Glucose, Bld: 153 mg/dL — ABNORMAL HIGH (ref 70–99)

## 2020-03-20 ENCOUNTER — Other Ambulatory Visit: Payer: 59

## 2020-03-20 LAB — FRUCTOSAMINE: Fructosamine: 306 umol/L — ABNORMAL HIGH (ref 0–285)

## 2020-03-26 ENCOUNTER — Ambulatory Visit (INDEPENDENT_AMBULATORY_CARE_PROVIDER_SITE_OTHER): Payer: 59 | Admitting: Endocrinology

## 2020-03-26 ENCOUNTER — Encounter: Payer: Self-pay | Admitting: Endocrinology

## 2020-03-26 ENCOUNTER — Other Ambulatory Visit: Payer: Self-pay

## 2020-03-26 VITALS — BP 138/80 | HR 78 | Ht 65.0 in | Wt 276.2 lb

## 2020-03-26 DIAGNOSIS — E119 Type 2 diabetes mellitus without complications: Secondary | ICD-10-CM | POA: Diagnosis not present

## 2020-03-26 DIAGNOSIS — E1165 Type 2 diabetes mellitus with hyperglycemia: Secondary | ICD-10-CM

## 2020-03-26 MED ORDER — INSULIN NPH (HUMAN) (ISOPHANE) 100 UNIT/ML ~~LOC~~ SUSP: SUBCUTANEOUS | 1 refills | Status: DC

## 2020-03-26 MED FILL — HumuLIN N 100 UNIT/ML SUSP: 100 | 28 days supply | Qty: 10 | Fill #0

## 2020-03-26 NOTE — Progress Notes (Signed)
Patient ID: Dawn Young, female   DOB: 02-12-1981, 39 y.o.   MRN: [id number]           Reason for Appointment: Follow-up for Type 2 Diabetes  Referring PCP: Dr. Tedra Senegal   History of Present Illness:          Date of diagnosis of type 2 diabetes mellitus: 09/2019       Background history:  She had a random glucose of 175 in 2020 but her diagnosis was not made until 3/21 At that time she went to the urgent care center because of vaginal discharge and itching and was found to have glycosuria and blood sugar over 200 Lab glucose subsequently was 436 and baseline A1c 12.4   Recent history:   Most recent A1c is 11.2 done on 02/06/2020  fructosamine is 306  INSULIN regimen is:  NPH 12 hs     Non-insulin hypoglycemic drugs the patient is taking are: Metformin ER 1500 mg daily, Ozempic 0.25 mg weekly  Current management, blood sugar patterns and problems identified:  She was started on Ozempic on her initial consultation on 8/2  She has tolerated 0.25 mg weekly  However in the last week or so with increasing her Metformin to 3 tablets she is getting some nausea and diarrhea  She was started back on 8 units of NPH at bedtime and told to increase the dose, now taking 12 units  The last 3 readings in the mornings are below 120  Taking enough readings after meals and only a couple of readings in the afternoon  She feels better overall  Weight has leveled off She does try to exercise regularly also       Side effects from medications have been: None     Meal times are:  Breakfast is at 8 am Lunch: 2 pm  Dinner: 7-8 pm    Typical meal intake: Breakfast is eggs and bacon, lunch [place] burger, dinner salad with fish               Exercise: Walking on treadmill  15-30 min almost daily  Glucose monitoring:  done 1-2 times a day         Glucometer:   Freestyle lite    Blood Glucose readings by time of day and averages from meter download:   PRE-MEAL Fasting Lunch Dinner  Bedtime Overall  Glucose range:  107-283   157    Mean/median: 160     172   POST-MEAL PC Breakfast PC Lunch PC Dinner  Glucose range:   249   Mean/median:      Previously:  PREMEAL Breakfast Lunch Dinner Bedtime  Overall   Glucose range:  247-369  253-381   458,488   Median:      339    Dietician visit, most recent: 03/12/20  Weight history: Previous range 260-287  Wt Readings from Last 3 Encounters:  03/26/20 276 lb 3.2 oz (125.3 kg)  03/12/20 279 lb 11.2 oz (126.9 kg)  02/26/20 275 lb 3.2 oz (124.8 kg)    Glycemic control:   Lab Results  Component Value Date   HGBA1C 11.2 (H) 02/06/2020   HGBA1C 10.2 (H) 12/19/2019   HGBA1C 12.4 (H) 10/31/2019   Lab Results  Component Value Date   LDLCALC 80 03/19/2020   CREATININE 0.77 10/31/2019   No results found for: MICRALBCREAT  Lab Results  Component Value Date   FRUCTOSAMINE 306 (H) 03/19/2020    No visits with results within  1 Week(s) from this visit.  Latest known visit with results is:  Lab on 03/19/2020  Component Date Value Ref Range Status  . Cholesterol 03/19/2020 146  0 - 200 mg/dL Final   ATP III Classification       Desirable:  < 200 mg/dL               Borderline High:  200 - 239 mg/dL          High:  > = 240 mg/dL  . Triglycerides 03/19/2020 132.0  0 - 149 mg/dL Final   Normal:  <150 mg/dLBorderline High:  150 - 199 mg/dL  . HDL 03/19/2020 39.70  >39.00 mg/dL Final  . VLDL 03/19/2020 26.4  0.0 - 40.0 mg/dL Final  . LDL Cholesterol 03/19/2020 80  0 - 99 mg/dL Final  . Total CHOL/HDL Ratio 03/19/2020 4   Final                  Men          Women1/2 Average Risk     3.4          3.3Average Risk          5.0          4.42X Average Risk          9.6          7.13X Average Risk          15.0          11.0                      . NonHDL 03/19/2020 106.01   Final   NOTE:  Non-HDL goal should be 30 mg/dL higher than patient's LDL goal (i.e. LDL goal of < 70 mg/dL, would have non-HDL goal of < 100 mg/dL)  .  Fructosamine 03/19/2020 306* 0 - 285 umol/L Final   Comment: Published reference interval for apparently healthy subjects between age 42 and 61 is 40 - 285 umol/L and in a poorly controlled diabetic population is 228 - 563 umol/L with a mean of 396 umol/L.   Marland Kitchen Glucose, Bld 03/19/2020 153* 70 - 99 mg/dL Final    Allergies as of 03/26/2020   No Known Allergies     Medication List       Accurate as of March 26, 2020  2:24 PM. If you have any questions, ask your nurse or doctor.        amLODipine 5 MG tablet Commonly known as: NORVASC Take 1 tablet (5 mg total) by mouth daily.   freestyle lancets SMARTSIG:1 Each Topical 4 Times Daily   FREESTYLE LITE test strip Generic drug: glucose blood 1 each by Other route 4 (four) times daily.   insulin NPH Human 100 UNIT/ML injection Commonly known as: HumuLIN N 12 U hs What changed: additional instructions Changed by: Elayne Snare, MD   metFORMIN 500 MG 24 hr tablet Commonly known as: GLUCOPHAGE-XR Take 4 tablets (2,000 mg total) by mouth daily with supper.   Ozempic (0.25 or 0.5 MG/DOSE) 2 MG/1.5ML Sopn Generic drug: Semaglutide(0.25 or 0.5MG /DOS) Inject 0.375 mLs (0.5 mg total) into the skin once a week.   sitaGLIPtin 100 MG tablet Commonly known as: Januvia Take 1 tablet (100 mg total) by mouth daily.       Allergies: No Known Allergies  Past Medical History:  Diagnosis Date  . History of kidney stones   . Hx of migraine headaches   .  Hx of migraines     Past Surgical History:  Procedure Laterality Date  . BREAST REDUCTION SURGERY Bilateral 06/24/2017   Procedure: MAMMARY REDUCTION  (BREAST);  Surgeon: Crissie Reese, MD;  Location: [facility];  Service: Plastics;  Laterality: Bilateral;  . BREAST SURGERY Bilateral 05/2017  . CESAREAN SECTION  03, 05, 06   x's 3  . CHOLECYSTECTOMY  2007  . ESOPHAGOGASTRODUODENOSCOPY N/A 03/02/2019   Procedure: ESOPHAGOGASTRODUODENOSCOPY (EGD);  Surgeon: Milus Banister, MD;   Location: [facility];  Service: Endoscopy;  Laterality: N/A;  . EUS N/A 03/02/2019   Procedure: UPPER ENDOSCOPIC ULTRASOUND (EUS) RADIAL;  Surgeon: Milus Banister, MD;  Location: [facility];  Service: Endoscopy;  Laterality: N/A;  . FINE NEEDLE ASPIRATION N/A 03/02/2019   Procedure: FINE NEEDLE ASPIRATION (FNA) LINEAR;  Surgeon: Milus Banister, MD;  Location: [facility];  Service: Endoscopy;  Laterality: N/A;  . TUBAL LIGATION  2006    Family History  Problem Relation Age of Onset  . Healthy Mother   . Healthy Father   . Gout Father   . Healthy Maternal Grandmother   . Healthy Maternal Grandfather   . Healthy Paternal Grandmother   . Healthy Paternal Grandfather   . Colon cancer Neg Hx   . Esophageal cancer Neg Hx   . Diabetes Neg Hx     Social History:  reports that she has quit smoking. Her smoking use included cigarettes. She has a 0.50 pack-year smoking history. She has never used smokeless tobacco. She reports current alcohol use. She reports that she does not use drugs.   Review of Systems   Lipid history: No history of hypercholesterolemia    Lab Results  Component Value Date   CHOL 146 03/19/2020   HDL 39.70 03/19/2020   LDLCALC 80 03/19/2020   TRIG 132.0 03/19/2020   CHOLHDL 4 03/19/2020           Hypertension: Has been treated with amlodipine alone  BP Readings from Last 3 Encounters:  03/26/20 138/80  02/26/20 124/90  02/06/20 120/90     Most recent foot exam: 8/21  Currently known complications of diabetes: None  LABS:  No visits with results within 1 Week(s) from this visit.  Latest known visit with results is:  Lab on 03/19/2020  Component Date Value Ref Range Status  . Cholesterol 03/19/2020 146  0 - 200 mg/dL Final   ATP III Classification       Desirable:  < 200 mg/dL               Borderline High:  200 - 239 mg/dL          High:  > = 240 mg/dL  . Triglycerides 03/19/2020 132.0  0 - 149 mg/dL Final   Normal:  <150 mg/dLBorderline  High:  150 - 199 mg/dL  . HDL 03/19/2020 39.70  >39.00 mg/dL Final  . VLDL 03/19/2020 26.4  0.0 - 40.0 mg/dL Final  . LDL Cholesterol 03/19/2020 80  0 - 99 mg/dL Final  . Total CHOL/HDL Ratio 03/19/2020 4   Final                  Men          Women1/2 Average Risk     3.4          3.3Average Risk          5.0          4.42X Average Risk  9.6          7.13X Average Risk          15.0          11.0                      . NonHDL 03/19/2020 106.01   Final   NOTE:  Non-HDL goal should be 30 mg/dL higher than patient's LDL goal (i.e. LDL goal of < 70 mg/dL, would have non-HDL goal of < 100 mg/dL)  . Fructosamine 03/19/2020 306* 0 - 285 umol/L Final   Comment: Published reference interval for apparently healthy subjects between age 75 and 71 is 39 - 285 umol/L and in a poorly controlled diabetic population is 228 - 563 umol/L with a mean of 396 umol/L.   Marland Kitchen Glucose, Bld 03/19/2020 153* 70 - 99 mg/dL Final    Physical Examination:  BP 138/80 (BP Location: Left Arm, Patient Position: Sitting, Cuff Size: Large)   Pulse 78   Ht 5\' 5"  (1.651 m)   Wt 276 lb 3.2 oz (125.3 kg)   BMI 45.96 kg/m    ASSESSMENT:  Diabetes type 2 with morbid obesity, associated with acanthosis  See history of present illness for detailed discussion of current diabetes management, blood sugar patterns and problems identified  Most recent A1c is 11  Current treatment regimen is Metformin, Ozempic and bedtime low-dose NPH insulin  Blood sugars are significantly better with adding Ozempic and Metformin Fasting readings are near normal now but needs to check readings after meals  HYPERTENSION: Blood pressure is improving  PLAN:     To check more readings after meals and discussed blood sugar targets at various times  Reduce NPH to 10 instead of 12 units and may need to reduce further  Take only 1000 mg of Metformin ER for better tolerability  Increase Ozempic on the next dosage up to 0.5 mg but may  consider 1 mg for significant weight loss  Consider adding ACE inhibitor for better blood pressure control and long-term benefits  Does not have hyperlipidemia  Follow-up: 1 month    Patient Instructions  10 U insulin today  Metformin 2 daily  Check blood sugars on waking up 4-5  days a week  Also check blood sugars about 2 hours after meals and do this after different meals by rotation  Recommended blood sugar levels on waking up are 90-130 and about 2 hours after meal is 130-160  Please bring your blood sugar monitor to each visit, thank you        Elayne Snare 03/26/2020, 2:24 PM   Note: This office note was prepared with Dragon voice recognition system technology. Any transcriptional errors that result from this process are unintentional.

## 2020-03-26 NOTE — Patient Instructions (Addendum)
10 U insulin today  Metformin 2 daily  Check blood sugars on waking up 4-5  days a week  Also check blood sugars about 2 hours after meals and do this after different meals by rotation  Recommended blood sugar levels on waking up are 90-130 and about 2 hours after meal is 130-160  Please bring your blood sugar monitor to each visit, thank you

## 2020-03-28 MED FILL — FREESTYLE LITE TEST STRIP: 25 days supply | Qty: 100 | Fill #0

## 2020-04-16 MED FILL — OZEMPIC 0.25 OR 0.5 MG/DOSE: 2 | 28 days supply | Qty: 2 | Fill #0

## 2020-05-02 ENCOUNTER — Other Ambulatory Visit: Payer: 59

## 2020-05-06 ENCOUNTER — Ambulatory Visit: Payer: 59 | Admitting: Endocrinology

## 2020-05-21 NOTE — Progress Notes (Deleted)
[place]   [phone] [phone]   Clinic Follow up Note   Patient Care Team: Elby Showers, MD as PCP - General (Internal Medicine) Milus Banister, MD as Attending Physician (Gastroenterology) Stark Klein, MD as Consulting Physician (Surgical Oncology) 05/21/2020  CHIEF COMPLAINT: Follow-up anemia  CURRENT THERAPY: Venofer 200 mg as needed, last given 4/30-5/14 in 5 doses  INTERVAL HISTORY: Dawn Young returns for follow-up as scheduled.  She was last seen for initial consult 6 months ago.  Iron panel on 02/06/2020 at PCP was adequate with ferritin of 208.   REVIEW OF SYSTEMS:   Constitutional: Denies fevers, chills or abnormal weight loss Eyes: Denies blurriness of vision Ears, nose, mouth, throat, and face: Denies mucositis or sore throat Respiratory: Denies cough, dyspnea or wheezes Cardiovascular: Denies palpitation, chest discomfort or lower extremity swelling Gastrointestinal:  Denies nausea, heartburn or change in bowel habits Skin: Denies abnormal skin rashes Lymphatics: Denies new lymphadenopathy or easy bruising Neurological:Denies numbness, tingling or new weaknesses Behavioral/Psych: Mood is stable, no new changes  All other systems were reviewed with the patient and are negative.  MEDICAL HISTORY:  Past Medical History:  Diagnosis Date  . History of kidney stones   . Hx of migraine headaches   . Hx of migraines     SURGICAL HISTORY: Past Surgical History:  Procedure Laterality Date  . BREAST REDUCTION SURGERY Bilateral 06/24/2017   Procedure: MAMMARY REDUCTION  (BREAST);  Surgeon: Crissie Reese, MD;  Location: [facility];  Service: Plastics;  Laterality: Bilateral;  . BREAST SURGERY Bilateral 05/2017  . CESAREAN SECTION  03, 05, 06   x's 3  . CHOLECYSTECTOMY  2007  . ESOPHAGOGASTRODUODENOSCOPY N/A 03/02/2019   Procedure: ESOPHAGOGASTRODUODENOSCOPY (EGD);  Surgeon: Milus Banister, MD;  Location: [facility];   Service: Endoscopy;  Laterality: N/A;  . EUS N/A 03/02/2019   Procedure: UPPER ENDOSCOPIC ULTRASOUND (EUS) RADIAL;  Surgeon: Milus Banister, MD;  Location: [facility];  Service: Endoscopy;  Laterality: N/A;  . FINE NEEDLE ASPIRATION N/A 03/02/2019   Procedure: FINE NEEDLE ASPIRATION (FNA) LINEAR;  Surgeon: Milus Banister, MD;  Location: [facility];  Service: Endoscopy;  Laterality: N/A;  . TUBAL LIGATION  2006    I have reviewed the social history and family history with the patient and they are unchanged from previous note.  ALLERGIES:  has No Known Allergies.  MEDICATIONS:  Current Outpatient Medications  Medication Sig Dispense Refill  . amLODipine (NORVASC) 5 MG tablet Take 1 tablet (5 mg total) by mouth daily. 90 tablet 1  . FREESTYLE LITE test strip 1 each by Other route 4 (four) times daily. 100 each 11  . insulin NPH Human (HUMULIN N) 100 UNIT/ML injection 12 U hs 10 mL 1  . Lancets (FREESTYLE) lancets SMARTSIG:1 Each Topical 4 Times Daily    . metFORMIN (GLUCOPHAGE-XR) 500 MG 24 hr tablet Take 4 tablets (2,000 mg total) by mouth daily with supper. 120 tablet 3  . Semaglutide,0.25 or 0.5MG /DOS, (OZEMPIC, 0.25 OR 0.5 MG/DOSE,) 2 MG/1.5ML SOPN Inject 0.375 mLs (0.5 mg total) into the skin once a week. 1 pen 2   No current facility-administered medications for this visit.    PHYSICAL EXAMINATION: ECOG PERFORMANCE STATUS: {CHL ONC ECOG [phone]}  There were no vitals filed for this visit. There were no vitals filed for this visit.  GENERAL:alert, no distress and comfortable SKIN: skin color, texture, turgor are normal, no rashes or significant lesions EYES: normal, Conjunctiva are pink and  non-injected, sclera clear OROPHARYNX:no exudate, no erythema and lips, buccal mucosa, and tongue normal  NECK: supple, thyroid normal size, non-tender, without nodularity LYMPH:  no palpable lymphadenopathy in the cervical, axillary or inguinal LUNGS: clear to auscultation and  percussion with normal breathing effort HEART: regular rate & rhythm and no murmurs and no lower extremity edema ABDOMEN:abdomen soft, non-tender and normal bowel sounds Musculoskeletal:no cyanosis of digits and no clubbing  NEURO: alert & oriented x 3 with fluent speech, no focal motor/sensory deficits  LABORATORY DATA:  I have reviewed the data as listed CBC Latest Ref Rng & Units 12/19/2019 11/24/2019 10/31/2019  WBC 3.8 - 10.8 Thousand/uL 6.8 6.8 9.8  Hemoglobin 11.7 - 15.5 g/dL 12.3 10.9(L) 10.5(L)  Hematocrit 35 - 45 % 37.7 33.8(L) 34.2(L)  Platelets 140 - 400 Thousand/uL 285 291 304     CMP Latest Ref Rng & Units 03/19/2020 10/31/2019 01/18/2019  Glucose 70 - 99 mg/dL 153(H) 436(H) 137(H)  BUN 7 - 25 mg/dL - 10 8  Creatinine 0.50 - 1.10 mg/dL - 0.77 0.90  Sodium 135 - 146 mmol/L - 133(L) 136  Potassium 3.5 - 5.3 mmol/L - 4.9 4.0  Chloride 98 - 110 mmol/L - 97(L) 103  CO2 20 - 32 mmol/L - 28 22  Calcium 8.6 - 10.2 mg/dL - 9.7 9.6  Total Protein 6.1 - 8.1 g/dL - 7.7 8.6(H)  Total Bilirubin 0.2 - 1.2 mg/dL - 0.2 0.6  Alkaline Phos 38 - 126 U/L - - 100  AST 10 - 30 U/L - 19 19  ALT 6 - 29 U/L - 29 20      RADIOGRAPHIC STUDIES: I have personally reviewed the radiological images as listed and agreed with the findings in the report. No results found.   ASSESSMENT & PLAN:  No problem-specific Assessment & Plan notes found for this encounter.   No orders of the defined types were placed in this encounter.  All questions were answered. The patient knows to call the clinic with any problems, questions or concerns. No barriers to learning was detected. I spent {CHL ONC TIME VISIT - PVVZS:[id number]} counseling the patient face to face. The total time spent in the appointment was {CHL ONC TIME VISIT - GBEEF:[id number]} and more than 50% was on counseling and review of test results     Alla Feeling, NP 05/21/20

## 2020-05-22 ENCOUNTER — Inpatient Hospital Stay: Payer: 59 | Attending: Nurse Practitioner | Admitting: Nurse Practitioner

## 2020-05-22 ENCOUNTER — Inpatient Hospital Stay: Payer: 59

## 2020-05-23 ENCOUNTER — Telehealth: Payer: Self-pay | Admitting: Nurse Practitioner

## 2020-05-23 NOTE — Telephone Encounter (Signed)
Called pt per 10/27 sch msg - no answer. Left message for patient to call back and reschedule appt .

## 2020-06-03 ENCOUNTER — Other Ambulatory Visit: Payer: 59

## 2020-06-03 ENCOUNTER — Other Ambulatory Visit (INDEPENDENT_AMBULATORY_CARE_PROVIDER_SITE_OTHER): Payer: 59

## 2020-06-03 ENCOUNTER — Other Ambulatory Visit: Payer: Self-pay

## 2020-06-03 DIAGNOSIS — E119 Type 2 diabetes mellitus without complications: Secondary | ICD-10-CM | POA: Diagnosis not present

## 2020-06-03 LAB — BASIC METABOLIC PANEL
BUN: 9 mg/dL (ref 6–23)
CO2: 30 mEq/L (ref 19–32)
Calcium: 9.1 mg/dL (ref 8.4–10.5)
Chloride: 101 mEq/L (ref 96–112)
Creatinine, Ser: 0.73 mg/dL (ref 0.40–1.20)
GFR: 104.79 mL/min (ref >60.00)
Glucose, Bld: 245 mg/dL — ABNORMAL HIGH (ref 70–99)
Potassium: 3.9 mEq/L (ref 3.5–5.1)
Sodium: 135 mEq/L (ref 135–145)

## 2020-06-03 LAB — MICROALBUMIN / CREATININE URINE RATIO
Creatinine,U: 150.8 mg/dL
Microalb Creat Ratio: 3.4 mg/g (ref 0.0–30.0)
Microalb, Ur: 5.2 mg/dL — ABNORMAL HIGH (ref 0.0–1.9)

## 2020-06-04 LAB — HEMOGLOBIN A1C
Hgb A1c MFr Bld: 10.1 % of total Hgb — ABNORMAL HIGH (ref <5.7)
Mean Plasma Glucose: 243 (calc)
eAG (mmol/L): 13.5 (calc)

## 2020-06-11 DIAGNOSIS — Z7689 Persons encountering health services in other specified circumstances: Secondary | ICD-10-CM | POA: Diagnosis not present

## 2020-06-11 DIAGNOSIS — K8689 Other specified diseases of pancreas: Secondary | ICD-10-CM | POA: Diagnosis not present

## 2020-06-11 DIAGNOSIS — D3A8 Other benign neuroendocrine tumors: Secondary | ICD-10-CM | POA: Diagnosis not present

## 2020-06-11 DIAGNOSIS — Z6841 Body Mass Index (BMI) 40.0 and over, adult: Secondary | ICD-10-CM | POA: Diagnosis not present

## 2020-06-12 ENCOUNTER — Other Ambulatory Visit (HOSPITAL_COMMUNITY): Payer: Self-pay | Admitting: Endocrinology

## 2020-06-12 ENCOUNTER — Ambulatory Visit (INDEPENDENT_AMBULATORY_CARE_PROVIDER_SITE_OTHER): Payer: 59 | Admitting: Endocrinology

## 2020-06-12 ENCOUNTER — Other Ambulatory Visit: Payer: Self-pay

## 2020-06-12 ENCOUNTER — Encounter: Payer: Self-pay | Admitting: Endocrinology

## 2020-06-12 VITALS — BP 128/84 | HR 77 | Ht 65.0 in | Wt 271.8 lb

## 2020-06-12 DIAGNOSIS — E1165 Type 2 diabetes mellitus with hyperglycemia: Secondary | ICD-10-CM | POA: Diagnosis not present

## 2020-06-12 MED ORDER — FREESTYLE LIBRE 2 SENSOR MISC: 2.0000 | 3 refills | Status: DC

## 2020-06-12 MED ORDER — OZEMPIC (1 MG/DOSE) 2 MG/1.5ML ~~LOC~~ SOPN: 1.0000 mg | PEN_INJECTOR | SUBCUTANEOUS | 1 refills | Status: DC

## 2020-06-12 MED FILL — OZEMPIC (1 MG/DOSE) 4 MG/3M: 4 | 28 days supply | Qty: 3 | Fill #0

## 2020-06-12 NOTE — Patient Instructions (Addendum)
12 N insulin  Check blood sugars on waking up 3-4 days a week  Also check blood sugars about 2 hours after meals and do this after different meals by rotation  Recommended blood sugar levels on waking up are 90-130 and about 2 hours after meal is 130-160  Please bring your blood sugar monitor to each visit, thank you   Ozempic 1mg  q week

## 2020-06-12 NOTE — Progress Notes (Signed)
Patient ID: Dawn Young, female   DOB: February 28, 1981, 39 y.o.   MRN: [id number]           Reason for Appointment: Follow-up for Type 2 Diabetes  Referring PCP: Dr. Tedra Senegal   History of Present Illness:          Date of diagnosis of type 2 diabetes mellitus: 09/2019       Background history:  She had a random glucose of 175 in 2020 but her diagnosis was not made until 3/21 At that time she went to the urgent care center because of vaginal discharge and itching and was found to have glycosuria and blood sugar over 200 Lab glucose subsequently was 436 and baseline A1c 12.4   Recent history:   Most recent A1c is 10.1 compared to 11.2 done on 02/06/2020  fructosamine was last 306  INSULIN regimen is:  NPH 10 hs     Non-insulin hypoglycemic drugs the patient is taking are: Metformin ER 0 mg daily, Ozempic 0.5 mg weekly  Current management, blood sugar patterns and problems identified:  She was gone up on Ozempic to the 0.5 mg dose without nausea  However she could not tolerate the Metformin 1000 mg a day and has not taken any for the last week because of nausea and diarrhea  She is now admits that she is not taking her insulin regularly at night partly because of busy and increased work schedule  However she has not missed her Ozempic  Her blood sugar monitoring has been very sporadic  Only in the last 3 to 4 days her blood sugars appear to be approaching normal  However after breakfast today blood sugar was 245 about a week ago when she has had readings as high as 363 in the evenings last month  Her weight appears to be slowly coming down She does try to exercise regularly also on her treadmill      Side effects from medications have been: None     Meal times are:  Breakfast is at 3 am Lunch: 2 pm  Dinner: 7-8 pm    Typical meal intake: Breakfast is eggs and sausage, lunch [place] burger, dinner salad with fish               Exercise: Walking on treadmill  15-30 min  almost daily  Glucose monitoring:  done 0 -2 times a day         Glucometer:   Freestyle lite    Blood Glucose readings by time of day and averages from meter download as above:  30-day average blood sugar meter 216  Previous readings:  PRE-MEAL Fasting Lunch Dinner Bedtime Overall  Glucose range:  107-283   157    Mean/median: 160     172   POST-MEAL PC Breakfast PC Lunch PC Dinner  Glucose range:   249   Mean/median:       Dietician visit, most recent: 03/12/20  Weight history: Previous range 260-287  Wt Readings from Last 3 Encounters:  06/12/20 271 lb 12.8 oz (123.3 kg)  03/26/20 276 lb 3.2 oz (125.3 kg)  03/12/20 279 lb 11.2 oz (126.9 kg)    Glycemic control:   Lab Results  Component Value Date   HGBA1C 10.1 (H) 06/03/2020   HGBA1C 11.2 (H) 02/06/2020   HGBA1C 10.2 (H) 12/19/2019   Lab Results  Component Value Date   MICROALBUR 5.2 (H) 06/03/2020   LDLCALC 80 03/19/2020   CREATININE 0.73 06/03/2020  Lab Results  Component Value Date   MICRALBCREAT 3.4 06/03/2020    Lab Results  Component Value Date   FRUCTOSAMINE 306 (H) 03/19/2020    No visits with results within 1 Week(s) from this visit.  Latest known visit with results is:  Lab on 06/03/2020  Component Date Value Ref Range Status  . Hgb A1c MFr Bld 06/03/2020 10.1* <5.7 % of total Hgb Final   Comment: For someone without known diabetes, a hemoglobin A1c value of 6.5% or greater indicates that they may have  diabetes and this should be confirmed with a follow-up  test. . For someone with known diabetes, a value <7% indicates  that their diabetes is well controlled and a value  greater than or equal to 7% indicates suboptimal  control. A1c targets should be individualized based on  duration of diabetes, age, comorbid conditions, and  other considerations. . Currently, no consensus exists regarding use of hemoglobin A1c for diagnosis of diabetes for children. .   . Mean Plasma Glucose  06/03/2020 243  (calc) Final  . eAG (mmol/L) 06/03/2020 13.5  (calc) Final    Allergies as of 06/12/2020   No Known Allergies     Medication List       Accurate as of June 12, 2020  2:47 PM. If you have any questions, ask your nurse or doctor.        STOP taking these medications   Ozempic (0.25 or 0.5 MG/DOSE) 2 MG/1.5ML Sopn Generic drug: Semaglutide(0.25 or 0.5MG /DOS) Replaced by: Ozempic (1 MG/DOSE) 2 MG/1.5ML Sopn Stopped by: Elayne Snare, MD     TAKE these medications   amLODipine 5 MG tablet Commonly known as: NORVASC Take 1 tablet (5 mg total) by mouth daily.   freestyle lancets SMARTSIG:1 Each Topical 4 Times Daily   FREESTYLE LITE test strip Generic drug: glucose blood 1 each by Other route 4 (four) times daily.   insulin NPH Human 100 UNIT/ML injection Commonly known as: HumuLIN N 12 U hs   metFORMIN 500 MG 24 hr tablet Commonly known as: GLUCOPHAGE-XR Take 4 tablets (2,000 mg total) by mouth daily with supper.   Ozempic (1 MG/DOSE) 2 MG/1.5ML Sopn Generic drug: Semaglutide (1 MG/DOSE) Inject 1 mg into the skin once a week. Replaces: Ozempic (0.25 or 0.5 MG/DOSE) 2 MG/1.5ML Sopn Started by: Elayne Snare, MD       Allergies: No Known Allergies  Past Medical History:  Diagnosis Date  . History of kidney stones   . Hx of migraine headaches   . Hx of migraines     Past Surgical History:  Procedure Laterality Date  . BREAST REDUCTION SURGERY Bilateral 06/24/2017   Procedure: MAMMARY REDUCTION  (BREAST);  Surgeon: Crissie Reese, MD;  Location: [facility];  Service: Plastics;  Laterality: Bilateral;  . BREAST SURGERY Bilateral 05/2017  . CESAREAN SECTION  03, 05, 06   x's 3  . CHOLECYSTECTOMY  2007  . ESOPHAGOGASTRODUODENOSCOPY N/A 03/02/2019   Procedure: ESOPHAGOGASTRODUODENOSCOPY (EGD);  Surgeon: Milus Banister, MD;  Location: [facility];  Service: Endoscopy;  Laterality: N/A;  . EUS N/A 03/02/2019   Procedure: UPPER ENDOSCOPIC ULTRASOUND (EUS)  RADIAL;  Surgeon: Milus Banister, MD;  Location: [facility];  Service: Endoscopy;  Laterality: N/A;  . FINE NEEDLE ASPIRATION N/A 03/02/2019   Procedure: FINE NEEDLE ASPIRATION (FNA) LINEAR;  Surgeon: Milus Banister, MD;  Location: [facility];  Service: Endoscopy;  Laterality: N/A;  . TUBAL LIGATION  2006    Family History  Problem Relation Age of Onset  . Healthy Mother   . Healthy Father   . Gout Father   . Healthy Maternal Grandmother   . Healthy Maternal Grandfather   . Healthy Paternal Grandmother   . Healthy Paternal Grandfather   . Colon cancer Neg Hx   . Esophageal cancer Neg Hx   . Diabetes Neg Hx     Social History:  reports that she has quit smoking. Her smoking use included cigarettes. She has a 0.50 pack-year smoking history. She has never used smokeless tobacco. She reports current alcohol use. She reports that she does not use drugs.   Review of Systems   Lipid history: No history of hypercholesterolemia    Lab Results  Component Value Date   CHOL 146 03/19/2020   HDL 39.70 03/19/2020   LDLCALC 80 03/19/2020   TRIG 132.0 03/19/2020   CHOLHDL 4 03/19/2020           Hypertension: Has been treated with amlodipine alone  Microalbumin is normal  BP Readings from Last 3 Encounters:  06/12/20 128/84  03/26/20 138/80  02/26/20 124/90     Most recent foot exam: 8/21  Currently known complications of diabetes: None  LABS:  No visits with results within 1 Week(s) from this visit.  Latest known visit with results is:  Lab on 06/03/2020  Component Date Value Ref Range Status  . Hgb A1c MFr Bld 06/03/2020 10.1* <5.7 % of total Hgb Final   Comment: For someone without known diabetes, a hemoglobin A1c value of 6.5% or greater indicates that they may have  diabetes and this should be confirmed with a follow-up  test. . For someone with known diabetes, a value <7% indicates  that their diabetes is well controlled and a value  greater than or equal  to 7% indicates suboptimal  control. A1c targets should be individualized based on  duration of diabetes, age, comorbid conditions, and  other considerations. . Currently, no consensus exists regarding use of hemoglobin A1c for diagnosis of diabetes for children. .   . Mean Plasma Glucose 06/03/2020 243  (calc) Final  . eAG (mmol/L) 06/03/2020 13.5  (calc) Final    Physical Examination:  BP 128/84   Pulse 77   Ht 5\' 5"  (1.651 m)   Wt 271 lb 12.8 oz (123.3 kg)   SpO2 98%   BMI 45.23 kg/m    ASSESSMENT:  Diabetes type 2 with morbid obesity, associated with acanthosis  See history of present illness for detailed discussion of current diabetes management, blood sugar patterns and problems identified  Her A1c is 10.2  Overall blood sugars are looking better than last 3 to 4 days her A1c is likely higher from not taking her insulin regularly as well as likely inconsistently with diet and stress She appears to respond fairly well with only low doses of bedtime NPH insulin but blood sugar monitoring has been minimal and not clear if she has high readings after meals in the afternoon and evening regularly   PLAN:     Ozempic 1 mg weekly, to call if she has any persistent nausea  NPH 12 units again and may need to adjust further based on fasting readings  Consistent glucose monitoring especially after meals, this will help with insulin adjustment, better meal planning and overall improved adherence to regimen  Explained to her the use of the freestyle libre system and she is agreeable to trying this if it is not too expensive, she will need  to make sure she is checking 3-4 times a day to get adequate data  Given her detailed literature on how to use this and she can download the app on her phone to start  Continue regular exercise  May consider SGLT2 drug also if blood sugars are not consistently controlled     Patient Instructions  12 N insulin  Check blood sugars on  waking up 3-4 days a week  Also check blood sugars about 2 hours after meals and do this after different meals by rotation  Recommended blood sugar levels on waking up are 90-130 and about 2 hours after meal is 130-160  Please bring your blood sugar monitor to each visit, thank you   Ozempic 1mg  q week        Elayne Snare 06/12/2020, 2:47 PM   Note: This office note was prepared with Dragon voice recognition system technology. Any transcriptional errors that result from this process are unintentional.

## 2020-07-15 MED FILL — OZEMPIC (1 MG/DOSE) 4 MG/3M: 4 | 28 days supply | Qty: 3 | Fill #1

## 2020-07-17 ENCOUNTER — Other Ambulatory Visit: Payer: 59

## 2020-07-24 ENCOUNTER — Ambulatory Visit: Payer: 59 | Admitting: Endocrinology

## 2020-08-30 MED FILL — OZEMPIC (1 MG/DOSE) 4 MG/3M: 4 | 28 days supply | Qty: 3 | Fill #1

## 2020-09-02 ENCOUNTER — Other Ambulatory Visit: Payer: Self-pay | Admitting: *Deleted

## 2020-09-02 ENCOUNTER — Other Ambulatory Visit (HOSPITAL_COMMUNITY): Payer: Self-pay | Admitting: Endocrinology

## 2020-09-02 MED ORDER — FREESTYLE LIBRE 2 SENSOR MISC
2.0000 | 0 refills | Status: DC
Start: 2020-09-02 — End: 2021-06-03

## 2020-09-02 MED ORDER — OZEMPIC (1 MG/DOSE) 2 MG/1.5ML ~~LOC~~ SOPN
1.0000 mg | PEN_INJECTOR | SUBCUTANEOUS | 0 refills | Status: DC
Start: 2020-09-02 — End: 2021-06-03

## 2020-09-17 DIAGNOSIS — F54 Psychological and behavioral factors associated with disorders or diseases classified elsewhere: Secondary | ICD-10-CM | POA: Diagnosis not present

## 2020-10-17 ENCOUNTER — Other Ambulatory Visit: Payer: Self-pay

## 2020-10-17 ENCOUNTER — Ambulatory Visit
Admission: EM | Admit: 2020-10-17 | Discharge: 2020-10-17 | Disposition: A | Discharge status: Home / Self Care | Payer: 59 | Attending: Emergency Medicine | Admitting: Emergency Medicine

## 2020-10-17 DIAGNOSIS — N61 Mastitis without abscess: Secondary | ICD-10-CM

## 2020-10-17 MED ORDER — DOXYCYCLINE HYCLATE 100 MG PO CAPS: 100.0000 mg | ORAL_CAPSULE | Freq: Two times a day (BID) | ORAL | 0 refills | Status: AC

## 2020-10-17 MED ORDER — IBUPROFEN 800 MG PO TABS: 800.0000 mg | ORAL_TABLET | Freq: Three times a day (TID) | ORAL | 0 refills | Status: AC

## 2020-10-17 NOTE — ED Triage Notes (Signed)
Pt presents with c/o right nipple pain with redness that began on Tuesday. Area around nipple is hard and red Pt reports breast reduction in 2018

## 2020-10-17 NOTE — Discharge Instructions (Signed)
Begin doxycycline twice daily for 1 week Warm compresses Anti-inflammatories Breast referral faxed to breast center for follow-up Over the weekend if symptoms progressing or worsening please follow-up here or emergency room

## 2020-10-17 NOTE — ED Provider Notes (Signed)
[facility]    CSN: [id number] Arrival date & time: 10/17/20  1033      History   Chief Complaint No chief complaint on file. Plus pain and swelling  HPI Dawn Young is a 40 y.o. female presenting today for evaluation of right breast pain.  Reports right nipple pain and redness beginning 2 days ago.  History of prior breast reduction in 2018.  Denies any drainage.  Has felt slightly feverish.  HPI  Past Medical History:  Diagnosis Date  . History of kidney stones   . Hx of migraine headaches   . Hx of migraines     Patient Active Problem List   Diagnosis Date Noted  . Abnormal CT scan   . Abnormal pancreas function test   . Pancreatic duct dilated   . Chronic pain of both knees 09/17/2017  . Acute bilateral low back pain without sciatica 09/17/2017  . Acute lumbar myofascial strain 09/17/2017  . Breast hypertrophy in female 06/24/2017  . Routine general medical examination at a health care facility 12/26/2015  . Thoracic back pain 12/26/2015  . Vitamin D deficiency 12/26/2015  . Iron deficiency anemia 12/26/2015  . Obesity 08/09/2013    Past Surgical History:  Procedure Laterality Date  . BREAST REDUCTION SURGERY Bilateral 06/24/2017   Procedure: MAMMARY REDUCTION  (BREAST);  Surgeon: Crissie Reese, MD;  Location: [place];  Service: Plastics;  Laterality: Bilateral;  . BREAST SURGERY Bilateral 05/2017  . CESAREAN SECTION  03, 05, 06   x's 3  . CHOLECYSTECTOMY  2007  . ESOPHAGOGASTRODUODENOSCOPY N/A 03/02/2019   Procedure: ESOPHAGOGASTRODUODENOSCOPY (EGD);  Surgeon: Milus Banister, MD;  Location: [facility];  Service: Endoscopy;  Laterality: N/A;  . EUS N/A 03/02/2019   Procedure: UPPER ENDOSCOPIC ULTRASOUND (EUS) RADIAL;  Surgeon: Milus Banister, MD;  Location: [facility];  Service: Endoscopy;  Laterality: N/A;  . FINE NEEDLE ASPIRATION N/A 03/02/2019   Procedure: FINE NEEDLE ASPIRATION (FNA) LINEAR;  Surgeon: Milus Banister, MD;  Location:  [facility];  Service: Endoscopy;  Laterality: N/A;  . TUBAL LIGATION  2006    OB History    Gravida  4   Para  4   Term      Preterm      AB      Living  4     SAB      IAB      Ectopic      Multiple      Live Births               Home Medications    Prior to Admission medications   Medication Sig Start Date End Date Taking? Authorizing Provider  doxycycline (VIBRAMYCIN) 100 MG capsule Take 1 capsule (100 mg total) by mouth 2 (two) times daily for 7 days. 10/17/20 10/24/20 Yes Manish Ruggiero C, PA-C  ibuprofen (ADVIL) 800 MG tablet Take 1 tablet (800 mg total) by mouth 3 (three) times daily. 10/17/20  Yes Amry Cathy C, PA-C  amLODipine (NORVASC) 5 MG tablet Take 1 tablet (5 mg total) by mouth daily. Patient not taking: Reported on 06/12/2020 11/07/19   Elby Showers, MD  Continuous Blood Gluc Sensor (FREESTYLE LIBRE 2 SENSOR) MISC 2 Devices by Does not apply route every 14 (fourteen) days. 09/02/20   Elayne Snare, MD  FREESTYLE LITE test strip 1 each by Other route 4 (four) times daily. 02/06/20   Elby Showers, MD  insulin NPH Human (HUMULIN N) 100  UNIT/ML injection 12 U hs 03/26/20   Elayne Snare, MD  Lancets (FREESTYLE) lancets SMARTSIG:1 Each Topical 4 Times Daily 10/31/19   [provider]  metFORMIN (GLUCOPHAGE-XR) 500 MG 24 hr tablet Take 4 tablets (2,000 mg total) by mouth daily with supper. Patient not taking: Reported on 06/12/2020 02/26/20   Elayne Snare, MD  Semaglutide, 1 MG/DOSE, (OZEMPIC, 1 MG/DOSE,) 2 MG/1.5ML SOPN Inject 1 mg into the skin once a week. 09/02/20   Elayne Snare, MD    Family History Family History  Problem Relation Age of Onset  . Healthy Mother   . Healthy Father   . Gout Father   . Healthy Maternal Grandmother   . Healthy Maternal Grandfather   . Healthy Paternal Grandmother   . Healthy Paternal Grandfather   . Colon cancer Neg Hx   . Esophageal cancer Neg Hx   . Diabetes Neg Hx     Social History Social History    Tobacco Use  . Smoking status: Former Smoker    Packs/day: 0.10    Years: 5.00    Pack years: 0.50    Types: Cigarettes  . Smokeless tobacco: Never Used  . Tobacco comment: 2 cigars a week  Vaping Use  . Vaping Use: Never used  Substance Use Topics  . Alcohol use: Yes    Comment: occasional  . Drug use: No     Allergies   Patient has no known allergies.   Review of Systems Review of Systems  Constitutional: Negative for fatigue and fever.  HENT: Negative for mouth sores.   Eyes: Negative for visual disturbance.  Respiratory: Negative for shortness of breath.   Cardiovascular: Negative for chest pain.  Gastrointestinal: Negative for abdominal pain, nausea and vomiting.  Genitourinary: Negative for genital sores.  Musculoskeletal: Negative for arthralgias and joint swelling.  Skin: Positive for color change. Negative for rash and wound.  Neurological: Negative for dizziness, weakness, light-headedness and headaches.     Physical Exam Triage Vital Signs ED Triage Vitals  Enc Vitals Group     BP 10/17/20 1048 (!) 174/103     Pulse Rate 10/17/20 1048 98     Resp 10/17/20 1048 18     Temp 10/17/20 1048 99.8 F (37.7 C)     Temp Source 10/17/20 1048 Oral     SpO2 10/17/20 1048 98 %     Weight --      Height --      Head Circumference --      Peak Flow --      Pain Score 10/17/20 1058 10     Pain Loc --      Pain Edu? --      Excl. in [place]? --    No data found.  Updated Vital Signs BP (!) 174/103 (BP Location: Left Arm)   Pulse 98   Temp 99.8 F (37.7 C) (Oral)   Resp 18   SpO2 98%   Visual Acuity Right Eye Distance:   Left Eye Distance:   Bilateral Distance:    Right Eye Near:   Left Eye Near:    Bilateral Near:     Physical Exam Vitals and nursing note reviewed.  Constitutional:      Appearance: She is well-developed.     Comments: No acute distress  HENT:     Head: Normocephalic and atraumatic.     Nose: Nose normal.  Eyes:      Conjunctiva/sclera: Conjunctivae normal.  Cardiovascular:     Rate  and Rhythm: Normal rate.  Pulmonary:     Effort: Pulmonary effort is normal. No respiratory distress.  Abdominal:     General: There is no distension.  Musculoskeletal:        General: Normal range of motion.     Cervical back: Neck supple.  Skin:    General: Skin is warm and dry.     Comments: Right breast with erythema and mild induration around areola, slight fluctuance within areola/near nipple, no obvious abscess palpated  Neurological:     Mental Status: She is alert and oriented to person, place, and time.      UC Treatments / Results  Labs (all labs ordered are listed, but only abnormal results are displayed) Labs Reviewed - No data to display  EKG   Radiology No results found.  Procedures Procedures (including critical care time)  Medications Ordered in UC Medications - No data to display  Initial Impression / Assessment and Plan / UC Course  I have reviewed the triage vital signs and the nursing notes.  Pertinent labs & imaging results that were available during my care of the patient were reviewed by me and considered in my medical decision making (see chart for details).     Right breast cellulitis versus abscess-placing on doxycycline warm compresses and anti-inflammatories, placing referral to breast and her for follow-up in case needing breast abscess aspiration.  Discussed strict return precautions. Patient verbalized understanding and is agreeable with plan.  Final Clinical Impressions(s) / UC Diagnoses   Final diagnoses:  Cellulitis of right breast     Discharge Instructions     Begin doxycycline twice daily for 1 week Warm compresses Anti-inflammatories Breast referral faxed to breast center for follow-up Over the weekend if symptoms progressing or worsening please follow-up here or emergency room    ED Prescriptions    Medication Sig Dispense Auth. Provider    doxycycline (VIBRAMYCIN) 100 MG capsule Take 1 capsule (100 mg total) by mouth 2 (two) times daily for 7 days. 14 capsule Kalan Rinn C, PA-C   ibuprofen (ADVIL) 800 MG tablet Take 1 tablet (800 mg total) by mouth 3 (three) times daily. 21 tablet Elier Zellars, [place] C, PA-C     PDMP not reviewed this encounter.   Joneen Caraway [place] C, PA-C 10/17/20 1224

## 2020-10-18 ENCOUNTER — Other Ambulatory Visit: Payer: Self-pay | Admitting: Emergency Medicine

## 2020-10-18 DIAGNOSIS — N61 Mastitis without abscess: Secondary | ICD-10-CM

## 2020-10-21 ENCOUNTER — Other Ambulatory Visit: Payer: Self-pay | Admitting: Emergency Medicine

## 2020-10-21 ENCOUNTER — Ambulatory Visit
Admission: RE | Admit: 2020-10-21 | Discharge: 2020-10-21 | Disposition: A | Discharge status: Home / Self Care | Payer: 59 | Source: Ambulatory Visit | Attending: Emergency Medicine | Admitting: Emergency Medicine

## 2020-10-21 ENCOUNTER — Other Ambulatory Visit: Payer: Self-pay

## 2020-10-21 DIAGNOSIS — N61 Mastitis without abscess: Secondary | ICD-10-CM

## 2020-10-21 DIAGNOSIS — N6323 Unspecified lump in the left breast, lower outer quadrant: Secondary | ICD-10-CM | POA: Diagnosis not present

## 2020-10-21 DIAGNOSIS — N6489 Other specified disorders of breast: Secondary | ICD-10-CM | POA: Diagnosis not present

## 2020-10-21 DIAGNOSIS — R928 Other abnormal and inconclusive findings on diagnostic imaging of breast: Secondary | ICD-10-CM | POA: Diagnosis not present

## 2020-10-21 DIAGNOSIS — N6320 Unspecified lump in the left breast, unspecified quadrant: Secondary | ICD-10-CM

## 2020-10-21 DIAGNOSIS — N63 Unspecified lump in unspecified breast: Secondary | ICD-10-CM

## 2020-10-25 ENCOUNTER — Other Ambulatory Visit: Payer: Self-pay

## 2020-10-25 ENCOUNTER — Ambulatory Visit
Admission: RE | Admit: 2020-10-25 | Discharge: 2020-10-25 | Disposition: A | Discharge status: Home / Self Care | Payer: 59 | Source: Ambulatory Visit | Attending: Emergency Medicine | Admitting: Emergency Medicine

## 2020-10-25 DIAGNOSIS — N6121 Granulomatous mastitis, right breast: Secondary | ICD-10-CM | POA: Diagnosis not present

## 2020-10-25 DIAGNOSIS — N61 Mastitis without abscess: Secondary | ICD-10-CM

## 2020-10-31 ENCOUNTER — Other Ambulatory Visit: Payer: 59

## 2020-11-05 ENCOUNTER — Other Ambulatory Visit: Payer: 59

## 2020-11-20 ENCOUNTER — Other Ambulatory Visit (HOSPITAL_COMMUNITY): Payer: Self-pay

## 2020-11-20 MED FILL — Semaglutide Soln Pen-inj 1 MG/DOSE (4 MG/3ML): SUBCUTANEOUS | 28 days supply | Qty: 3 | Fill #0 | Status: CN

## 2020-11-29 ENCOUNTER — Other Ambulatory Visit (HOSPITAL_COMMUNITY): Payer: Self-pay

## 2020-12-02 ENCOUNTER — Ambulatory Visit: Payer: 59 | Admitting: Internal Medicine

## 2020-12-02 ENCOUNTER — Telehealth: Payer: Self-pay | Admitting: Internal Medicine

## 2020-12-02 ENCOUNTER — Other Ambulatory Visit: Payer: Self-pay

## 2020-12-02 ENCOUNTER — Encounter: Payer: Self-pay | Admitting: Internal Medicine

## 2020-12-02 VITALS — BP 100/70 | HR 88 | Temp 98.2°F | Ht 65.0 in | Wt 270.0 lb

## 2020-12-02 DIAGNOSIS — D3A8 Other benign neuroendocrine tumors: Secondary | ICD-10-CM

## 2020-12-02 DIAGNOSIS — I10 Essential (primary) hypertension: Secondary | ICD-10-CM | POA: Diagnosis not present

## 2020-12-02 DIAGNOSIS — N611 Abscess of the breast and nipple: Secondary | ICD-10-CM | POA: Diagnosis not present

## 2020-12-02 DIAGNOSIS — E1165 Type 2 diabetes mellitus with hyperglycemia: Secondary | ICD-10-CM

## 2020-12-02 MED ORDER — BACTROBAN NASAL 2 % NA OINT: TOPICAL_OINTMENT | NASAL | 0 refills | Status: DC

## 2020-12-02 MED ORDER — DOXYCYCLINE HYCLATE 100 MG PO TABS: 100.0000 mg | ORAL_TABLET | Freq: Two times a day (BID) | ORAL | 0 refills | Status: DC

## 2020-12-02 MED ORDER — CEFTRIAXONE SODIUM 1 G IJ SOLR
1.0000 g | Freq: Once | INTRAMUSCULAR | Status: AC
  Administered 2020-12-02: 1 g via INTRAMUSCULAR

## 2020-12-02 NOTE — Telephone Encounter (Signed)
Dawn Young [phone]  [facility] called to say she has another knot on her right breast that is very sore, red and swollen. She tried to go to [facility] but they told her she would need order first and I also let her know she would need to see you first, so you could exam her and see what's going on.

## 2020-12-02 NOTE — Telephone Encounter (Signed)
4:45 pm today in person. This is a staph infection.

## 2020-12-02 NOTE — Telephone Encounter (Signed)
Scheduled

## 2020-12-02 NOTE — Patient Instructions (Addendum)
Use Bactroban in nostrils twice a day  5 days. Take Doxycycline 100 mg twice a day for 14 days. Watch accuchecks. Call if not improving in 48 hours.  Bathe in Hibiclens from neck down avoiding genital Area. Leave on for 5 monutes before rinsing to caot your skin. Continue this for at least 2-3 weeks then start using Dial soap for bathing. Use washcloth to bathe and change washcloth daily.

## 2020-12-02 NOTE — Progress Notes (Signed)
   Subjective:    Patient ID: Dawn Young, female    DOB: [birth year], 40 y.o.   MRN: [id number]  HPI 39 year old Female presents with history of diabetes mellitus presents with carbuncle of right breast.  Was seen in the emergency department March 24 with cellulitis of right breast with right nipple pain and redness onset 2 days prior.  History of breast reduction in 2018.  Was noted to have a right breast erythema and mild induration around the areola but no obvious abscess.  Was treated with doxycycline twice daily for 1 week and advised to use warm compresses.  She has a history of a neuroendocrine tumor of the pancreas being followed at [facility].  Also in February she began evaluation for gastric bypass surgery at [facility] as well.  She has a history of hypertension treated with amlodipine 5 mg daily.  Review of Systems see above     Objective:   Physical Exam Temperature 98.2 degrees pulse oximetry 99% weight 270 pounds BMI 44.93 blood pressure 100/70 She has draining lesion near her right areola.  Suspect underlying abscess.      Assessment & Plan:  Acute carbuncle right breast-may need incision and drainage-likely Staph infection  Plan: Advised patient to contact staff at [facility] regarding this.  We might need to consider surgical I&D here of this lesion.  She was placed on doxycycline 100 mg twice daily for 14 days.  She will apply warm compresses for 20 minutes at least twice daily to right breast.  Apply Bactroban to each nostril at bedtime for 7 days.  Monitor Accu-Cheks closely.  Call if not improving in 48 hours or sooner if worse.  They Boniva cleanse from neck down avoiding genital area.  Leave Hibiclens on for 5 minutes before rinsing.  This will help coat your skin and help to eradicate the staph infection from your skin.    Use this for 2 to 3 weeks and then start using Dial soap daily for bathing.  Use washcloth to bathe and change washcloth daily

## 2020-12-02 NOTE — Progress Notes (Deleted)
   Subjective:    Patient ID: Dawn Young, female    DOB: 1981-06-10, 40 y.o.   MRN: [id number]  HPI Onset late last week of nipple tenderness    Review of Systems history of neuroendocrine tumor followed at [facility] by CT surveillance.     Objective:   Physical Exam        Assessment & Plan:

## 2020-12-06 MED ORDER — FLUCONAZOLE 150 MG PO TABS: 150.0000 mg | ORAL_TABLET | Freq: Once | ORAL | 1 refills | Status: DC

## 2020-12-06 NOTE — Telephone Encounter (Signed)
Dawn Young called to say she is starting to get a yeast infection. She wanted to know if you can change antibiotic and call in something for the yeast infection.

## 2020-12-06 NOTE — Addendum Note (Signed)
Addended by: Mady Haagensen on: 12/06/2020 12:40 PM   Modules accepted: Orders

## 2020-12-06 NOTE — Telephone Encounter (Signed)
Please send in Diflucan 150 mg tab with one refill and let her know

## 2020-12-06 NOTE — Telephone Encounter (Signed)
Rx was sent, pt notified via mychart.

## 2020-12-24 ENCOUNTER — Other Ambulatory Visit (HOSPITAL_COMMUNITY): Payer: Self-pay

## 2020-12-24 ENCOUNTER — Other Ambulatory Visit: Payer: Self-pay | Admitting: Endocrinology

## 2020-12-24 ENCOUNTER — Other Ambulatory Visit: Payer: Self-pay | Admitting: Internal Medicine

## 2020-12-24 DIAGNOSIS — E119 Type 2 diabetes mellitus without complications: Secondary | ICD-10-CM

## 2020-12-24 MED ORDER — INSULIN NPH (HUMAN) (ISOPHANE) 100 UNIT/ML ~~LOC~~ SUSP
SUBCUTANEOUS | 0 refills | Status: DC
  Filled 2020-12-24: qty 10, 83d supply, fill #0

## 2020-12-24 MED FILL — Semaglutide Soln Pen-inj 1 MG/DOSE (4 MG/3ML): SUBCUTANEOUS | 28 days supply | Qty: 3 | Fill #0 | Status: CN

## 2020-12-24 MED FILL — Semaglutide Soln Pen-inj 1 MG/DOSE (4 MG/3ML): SUBCUTANEOUS | 28 days supply | Qty: 3 | Fill #0 | Status: AC

## 2020-12-25 ENCOUNTER — Other Ambulatory Visit (HOSPITAL_COMMUNITY): Payer: Self-pay

## 2020-12-25 NOTE — Telephone Encounter (Signed)
Yes, she said her blood sugar was causing this.

## 2020-12-27 ENCOUNTER — Other Ambulatory Visit (HOSPITAL_COMMUNITY): Payer: Self-pay

## 2020-12-30 ENCOUNTER — Other Ambulatory Visit (HOSPITAL_COMMUNITY): Payer: Self-pay

## 2021-01-21 DIAGNOSIS — E611 Iron deficiency: Secondary | ICD-10-CM | POA: Diagnosis not present

## 2021-01-21 DIAGNOSIS — D3A8 Other benign neuroendocrine tumors: Secondary | ICD-10-CM | POA: Diagnosis not present

## 2021-01-21 DIAGNOSIS — Z01818 Encounter for other preprocedural examination: Secondary | ICD-10-CM | POA: Diagnosis not present

## 2021-01-21 DIAGNOSIS — E349 Endocrine disorder, unspecified: Secondary | ICD-10-CM | POA: Diagnosis not present

## 2021-01-21 DIAGNOSIS — K8689 Other specified diseases of pancreas: Secondary | ICD-10-CM | POA: Diagnosis not present

## 2021-01-21 DIAGNOSIS — E559 Vitamin D deficiency, unspecified: Secondary | ICD-10-CM | POA: Diagnosis not present

## 2021-01-21 DIAGNOSIS — E538 Deficiency of other specified B group vitamins: Secondary | ICD-10-CM | POA: Diagnosis not present

## 2021-01-24 ENCOUNTER — Other Ambulatory Visit (HOSPITAL_COMMUNITY): Payer: Self-pay

## 2021-01-24 MED FILL — Semaglutide Soln Pen-inj 1 MG/DOSE (4 MG/3ML): SUBCUTANEOUS | 28 days supply | Qty: 3 | Fill #1 | Status: AC

## 2021-02-25 ENCOUNTER — Ambulatory Visit: Payer: 59 | Admitting: Internal Medicine

## 2021-02-26 ENCOUNTER — Encounter: Payer: Self-pay | Admitting: Nurse Practitioner

## 2021-02-26 ENCOUNTER — Encounter: Payer: Self-pay | Admitting: Internal Medicine

## 2021-02-26 ENCOUNTER — Other Ambulatory Visit: Payer: Self-pay

## 2021-02-26 ENCOUNTER — Emergency Department (HOSPITAL_BASED_OUTPATIENT_CLINIC_OR_DEPARTMENT_OTHER)
Admission: EM | Admit: 2021-02-26 | Discharge: 2021-02-26 | Disposition: A | Discharge status: Home / Self Care | Payer: 59 | Attending: Emergency Medicine | Admitting: Emergency Medicine

## 2021-02-26 ENCOUNTER — Telehealth: Payer: Self-pay | Admitting: Internal Medicine

## 2021-02-26 ENCOUNTER — Other Ambulatory Visit (HOSPITAL_COMMUNITY): Payer: Self-pay

## 2021-02-26 ENCOUNTER — Encounter (HOSPITAL_BASED_OUTPATIENT_CLINIC_OR_DEPARTMENT_OTHER): Payer: Self-pay

## 2021-02-26 DIAGNOSIS — Z87891 Personal history of nicotine dependence: Secondary | ICD-10-CM | POA: Insufficient documentation

## 2021-02-26 DIAGNOSIS — E119 Type 2 diabetes mellitus without complications: Secondary | ICD-10-CM | POA: Diagnosis not present

## 2021-02-26 DIAGNOSIS — N611 Abscess of the breast and nipple: Secondary | ICD-10-CM | POA: Diagnosis not present

## 2021-02-26 DIAGNOSIS — K8689 Other specified diseases of pancreas: Secondary | ICD-10-CM | POA: Diagnosis not present

## 2021-02-26 DIAGNOSIS — Z6841 Body Mass Index (BMI) 40.0 and over, adult: Secondary | ICD-10-CM | POA: Diagnosis not present

## 2021-02-26 DIAGNOSIS — N644 Mastodynia: Secondary | ICD-10-CM | POA: Diagnosis present

## 2021-02-26 DIAGNOSIS — I10 Essential (primary) hypertension: Secondary | ICD-10-CM | POA: Diagnosis not present

## 2021-02-26 DIAGNOSIS — Z794 Long term (current) use of insulin: Secondary | ICD-10-CM | POA: Diagnosis not present

## 2021-02-26 MED ORDER — FLUCONAZOLE 150 MG PO TABS
150.0000 mg | ORAL_TABLET | Freq: Every day | ORAL | 1 refills | Status: DC
  Filled 2021-02-26 – 2021-07-02 (×2): qty 1, 1d supply, fill #0

## 2021-02-26 MED ORDER — DOXYCYCLINE HYCLATE 100 MG PO CAPS
100.0000 mg | ORAL_CAPSULE | Freq: Two times a day (BID) | ORAL | 0 refills | Status: DC
  Filled 2021-02-26: qty 20, 10d supply, fill #0

## 2021-02-26 MED ORDER — IBUPROFEN 800 MG PO TABS
800.0000 mg | ORAL_TABLET | Freq: Once | ORAL | Status: AC
  Administered 2021-02-26: 800 mg via ORAL
  Filled 2021-02-26: qty 1

## 2021-02-26 MED ORDER — HYDROCODONE-ACETAMINOPHEN 5-325 MG PO TABS
1.0000 | ORAL_TABLET | Freq: Four times a day (QID) | ORAL | 0 refills | Status: DC | PRN
  Filled 2021-02-26: qty 6, 2d supply, fill #0

## 2021-02-26 MED ORDER — DOXYCYCLINE HYCLATE 100 MG PO TABS
100.0000 mg | ORAL_TABLET | Freq: Once | ORAL | Status: AC
  Administered 2021-02-26: 100 mg via ORAL
  Filled 2021-02-26: qty 1

## 2021-02-26 NOTE — Telephone Encounter (Signed)
She was in ED this am with breast infection. Had appt here yesterday but did not come. I have left phone voice mail message today I ED is expecting follow up here and/or Breast Center. Needs follow up here if to remain patient in practice. MJB,MD

## 2021-02-26 NOTE — ED Provider Notes (Signed)
[facility] EMERGENCY DEPT Provider Note   CSN: [id number] Arrival date & time: 02/26/21  0232     History Chief Complaint  Patient presents with   Breast Pain    Dawn Young is a 40 y.o. female.  The history is provided by the patient and medical records.  Dawn Young is a 40 y.o. female who presents to the Emergency Department complaining of breast pain. She presents the emergency department complaining of pain and swelling to the right breast for the last three days. She has experienced similar episodes in the past related to breast abscess. Her first abscess occurred in March of this year and drained spontaneously when she was at the breast center for imaging. She had a recurrent infection in May that drained spontaneously but did require antibiotics. She denies any associated fevers, nausea, vomiting, chills. She is a diabetic and blood sugars run around 140. She denies any chance of pregnancy.    Past Medical History:  Diagnosis Date   History of kidney stones    Hx of migraine headaches    Hx of migraines     Patient Active Problem List   Diagnosis Date Noted   Abnormal CT scan    Abnormal pancreas function test    Pancreatic duct dilated    Chronic pain of both knees 09/17/2017   Acute bilateral low back pain without sciatica 09/17/2017   Acute lumbar myofascial strain 09/17/2017   Breast hypertrophy in female 06/24/2017   Routine general medical examination at a health care facility 12/26/2015   Thoracic back pain 12/26/2015   Vitamin D deficiency 12/26/2015   Iron deficiency anemia 12/26/2015   Obesity 08/09/2013    Past Surgical History:  Procedure Laterality Date   BREAST REDUCTION SURGERY Bilateral 06/24/2017   Procedure: MAMMARY REDUCTION  (BREAST);  Surgeon: Crissie Reese, MD;  Location: [place];  Service: Plastics;  Laterality: Bilateral;   BREAST SURGERY Bilateral 05/2017   CESAREAN SECTION  03, 05, 06   x's 3   CHOLECYSTECTOMY   2007   ESOPHAGOGASTRODUODENOSCOPY N/A 03/02/2019   Procedure: ESOPHAGOGASTRODUODENOSCOPY (EGD);  Surgeon: Milus Banister, MD;  Location: [facility];  Service: Endoscopy;  Laterality: N/A;   EUS N/A 03/02/2019   Procedure: UPPER ENDOSCOPIC ULTRASOUND (EUS) RADIAL;  Surgeon: Milus Banister, MD;  Location: [facility];  Service: Endoscopy;  Laterality: N/A;   FINE NEEDLE ASPIRATION N/A 03/02/2019   Procedure: FINE NEEDLE ASPIRATION (FNA) LINEAR;  Surgeon: Milus Banister, MD;  Location: [facility];  Service: Endoscopy;  Laterality: N/A;   TUBAL LIGATION  2006     OB History     Gravida  4   Para  4   Term      Preterm      AB      Living  4      SAB      IAB      Ectopic      Multiple      Live Births              Family History  Problem Relation Age of Onset   Healthy Mother    Healthy Father    Gout Father    Healthy Maternal Grandmother    Healthy Maternal Grandfather    Healthy Paternal Grandmother    Healthy Paternal Grandfather    Colon cancer Neg Hx    Esophageal cancer Neg Hx    Diabetes Neg Hx     Social  History   Tobacco Use   Smoking status: Former    Packs/day: 0.10    Years: 5.00    Pack years: 0.50    Types: Cigarettes   Smokeless tobacco: Never   Tobacco comments:    2 cigars a week  Vaping Use   Vaping Use: Never used  Substance Use Topics   Alcohol use: Yes    Alcohol/week: 2.0 standard drinks    Types: 2 Glasses of wine per week    Comment: occasional   Drug use: No    Home Medications Prior to Admission medications   Medication Sig Start Date End Date Taking? Authorizing Provider  FREESTYLE LITE test strip 1 each by Other route 4 (four) times daily. 02/06/20  Yes Baxley, Cresenciano Lick, MD  ibuprofen (ADVIL) 800 MG tablet Take 1 tablet (800 mg total) by mouth 3 (three) times daily. 10/17/20  Yes Wieters, Hallie C, PA-C  Lancets (FREESTYLE) lancets SMARTSIG:1 Each Topical 4 Times Daily 10/31/19  Yes [provider]   Semaglutide, 1 MG/DOSE, (OZEMPIC, 1 MG/DOSE,) 2 MG/1.5ML SOPN Inject 1 mg into the skin once a week. 09/02/20  Yes Elayne Snare, MD  Semaglutide, 1 MG/DOSE, (OZEMPIC, 1 MG/DOSE,) 4 MG/3ML SOPN Inject 1 mg into the skin once a week. 09/02/20 09/02/21 Yes Elayne Snare, MD  amLODipine (NORVASC) 5 MG tablet Take 1 tablet (5 mg total) by mouth daily. 11/07/19   Elby Showers, MD  Continuous Blood Gluc Sensor (FREESTYLE LIBRE 2 SENSOR) MISC 2 Devices by Does not apply route every 14 (fourteen) days. 09/02/20   Elayne Snare, MD  doxycycline (VIBRA-TABS) 100 MG tablet Take 1 tablet (100 mg total) by mouth 2 (two) times daily. 12/02/20   Elby Showers, MD  insulin NPH Human (HUMULIN N) 100 UNIT/ML injection 12 U hs 03/26/20   Elayne Snare, MD  insulin NPH Human (HUMULIN N) 100 UNIT/ML injection Inject 12 units into the skin at bedtime 03/26/20     metFORMIN (GLUCOPHAGE-XR) 500 MG 24 hr tablet Take 4 tablets (2,000 mg total) by mouth daily with supper. 02/26/20   Elayne Snare, MD  mupirocin nasal ointment (BACTROBAN NASAL) 2 % Apply to each nostril at bedtime for 7 days 12/02/20   Elby Showers, MD    Allergies    Patient has no known allergies.  Review of Systems   Review of Systems  All other systems reviewed and are negative.  Physical Exam Updated Vital Signs BP 136/83 (BP Location: Right Arm)   Pulse 76   Temp 98.9 F (37.2 C) (Oral)   Resp 19   Ht '5\' 5"'$  (1.651 m)   Wt 115.2 kg   LMP 02/07/2021 (Approximate)   SpO2 100%   BMI 42.27 kg/m   Physical Exam Vitals and nursing note reviewed.  Constitutional:      Appearance: She is well-developed.  HENT:     Head: Normocephalic and atraumatic.  Cardiovascular:     Rate and Rhythm: Normal rate and regular rhythm.  Pulmonary:     Effort: Pulmonary effort is normal. No respiratory distress.  Chest:       Comments: 5x5 cm area of erythema and induration.  Inferior areolar area with small scab present and increased fullness.   Musculoskeletal:         General: No tenderness.  Skin:    General: Skin is warm and dry.  Neurological:     Mental Status: She is alert and oriented to person, place, and time.  Psychiatric:  Behavior: Behavior normal.    ED Results / Procedures / Treatments   Labs (all labs ordered are listed, but only abnormal results are displayed) Labs Reviewed - No data to display  EKG None  Radiology No results found.  Procedures Procedures   Medications Ordered in ED Medications  doxycycline (VIBRA-TABS) tablet 100 mg (has no administration in time range)    ED Course  I have reviewed the triage vital signs and the nursing notes.  Pertinent labs & imaging results that were available during my care of the patient were reviewed by me and considered in my medical decision making (see chart for details).    MDM Rules/Calculators/A&P                          patient with history of diabetes here for evaluation of three days of right breast pain. Examination is consistent with cellulitis and likely early abscess. Given inferior areolar region of this abscess feel this would be more appropriately managed at the breast center. Will start antibiotics with close outpatient follow-up and return precautions.  Final Clinical Impression(s) / ED Diagnoses Final diagnoses:  Breast abscess    Rx / DC Orders ED Discharge Orders          Ordered    Ambulatory referral to Breast Clinic       Comments: Abscess, right breast   Pending             Quintella Reichert, MD 02/26/21 [phone]

## 2021-02-26 NOTE — Discharge Instructions (Addendum)
Take the antibiotics as prescribed. Please reach out to the breath Center by the afternoon if you do not hear from them. If you develop a yeast infection you may take a dose of Diflucan after you have completed your antibiotics. If you continue to have a yeast infection you may take a second dose of Diflucan of three days after your first dose.

## 2021-02-26 NOTE — ED Triage Notes (Signed)
Pt states having right breast pain started three days ago. Red and swollen around nipple area. Pt. States 9/10 pain, putting hot compress and tylenol. Took tylenol around 2:30 yesterday/ relieved some pain.

## 2021-02-27 ENCOUNTER — Encounter: Payer: Self-pay | Admitting: Internal Medicine

## 2021-02-27 ENCOUNTER — Other Ambulatory Visit: Payer: Self-pay | Admitting: Endocrinology

## 2021-02-28 ENCOUNTER — Other Ambulatory Visit (HOSPITAL_COMMUNITY): Payer: Self-pay

## 2021-03-03 DIAGNOSIS — Z01810 Encounter for preprocedural cardiovascular examination: Secondary | ICD-10-CM | POA: Diagnosis not present

## 2021-03-06 ENCOUNTER — Other Ambulatory Visit (HOSPITAL_COMMUNITY): Payer: Self-pay

## 2021-04-25 ENCOUNTER — Other Ambulatory Visit: Payer: 59

## 2021-05-21 ENCOUNTER — Other Ambulatory Visit: Payer: 59

## 2021-05-22 ENCOUNTER — Other Ambulatory Visit: Payer: Self-pay

## 2021-05-22 ENCOUNTER — Other Ambulatory Visit (INDEPENDENT_AMBULATORY_CARE_PROVIDER_SITE_OTHER): Payer: 59

## 2021-05-22 DIAGNOSIS — E1165 Type 2 diabetes mellitus with hyperglycemia: Secondary | ICD-10-CM

## 2021-05-22 LAB — BASIC METABOLIC PANEL
BUN: 8 mg/dL (ref 6–23)
CO2: 28 mEq/L (ref 19–32)
Calcium: 9.4 mg/dL (ref 8.4–10.5)
Chloride: 101 mEq/L (ref 96–112)
Creatinine, Ser: 0.66 mg/dL (ref 0.40–1.20)
GFR: 111.02 mL/min (ref >60.00)
Glucose, Bld: 218 mg/dL — ABNORMAL HIGH (ref 70–99)
Potassium: 4.1 mEq/L (ref 3.5–5.1)
Sodium: 135 mEq/L (ref 135–145)

## 2021-05-23 ENCOUNTER — Ambulatory Visit: Payer: 59 | Admitting: Endocrinology

## 2021-05-23 DIAGNOSIS — Z01818 Encounter for other preprocedural examination: Secondary | ICD-10-CM | POA: Diagnosis not present

## 2021-05-23 DIAGNOSIS — E538 Deficiency of other specified B group vitamins: Secondary | ICD-10-CM | POA: Diagnosis not present

## 2021-05-23 DIAGNOSIS — R7989 Other specified abnormal findings of blood chemistry: Secondary | ICD-10-CM | POA: Diagnosis not present

## 2021-05-23 DIAGNOSIS — E611 Iron deficiency: Secondary | ICD-10-CM | POA: Diagnosis not present

## 2021-05-23 DIAGNOSIS — D500 Iron deficiency anemia secondary to blood loss (chronic): Secondary | ICD-10-CM | POA: Diagnosis not present

## 2021-05-23 DIAGNOSIS — K869 Disease of pancreas, unspecified: Secondary | ICD-10-CM | POA: Diagnosis not present

## 2021-05-23 DIAGNOSIS — I10 Essential (primary) hypertension: Secondary | ICD-10-CM | POA: Diagnosis not present

## 2021-05-23 DIAGNOSIS — Z794 Long term (current) use of insulin: Secondary | ICD-10-CM | POA: Diagnosis not present

## 2021-05-23 DIAGNOSIS — E119 Type 2 diabetes mellitus without complications: Secondary | ICD-10-CM | POA: Diagnosis not present

## 2021-05-23 DIAGNOSIS — K8689 Other specified diseases of pancreas: Secondary | ICD-10-CM | POA: Diagnosis not present

## 2021-05-23 LAB — FRUCTOSAMINE: Fructosamine: 348 umol/L — ABNORMAL HIGH (ref 0–285)

## 2021-06-03 ENCOUNTER — Other Ambulatory Visit (HOSPITAL_COMMUNITY): Payer: Self-pay

## 2021-06-03 ENCOUNTER — Encounter: Payer: Self-pay | Admitting: Endocrinology

## 2021-06-03 ENCOUNTER — Ambulatory Visit (INDEPENDENT_AMBULATORY_CARE_PROVIDER_SITE_OTHER): Payer: 59 | Admitting: Endocrinology

## 2021-06-03 ENCOUNTER — Other Ambulatory Visit: Payer: Self-pay

## 2021-06-03 VITALS — BP 150/100 | HR 77 | Ht 65.0 in | Wt 255.2 lb

## 2021-06-03 DIAGNOSIS — I10 Essential (primary) hypertension: Secondary | ICD-10-CM

## 2021-06-03 DIAGNOSIS — E669 Obesity, unspecified: Secondary | ICD-10-CM

## 2021-06-03 DIAGNOSIS — E1165 Type 2 diabetes mellitus with hyperglycemia: Secondary | ICD-10-CM

## 2021-06-03 DIAGNOSIS — E1169 Type 2 diabetes mellitus with other specified complication: Secondary | ICD-10-CM

## 2021-06-03 MED ORDER — HUMULIN 70/30 KWIKPEN (70-30) 100 UNIT/ML ~~LOC~~ SUPN
PEN_INJECTOR | SUBCUTANEOUS | 3 refills | Status: DC
  Filled 2021-06-03: qty 9, 30d supply, fill #0

## 2021-06-03 MED ORDER — FREESTYLE LIBRE 3 SENSOR MISC
1.0000 | 3 refills | Status: AC
  Filled 2021-06-03: qty 2, fill #0
  Filled 2021-06-03: qty 2, 28d supply, fill #0
  Filled 2021-06-30 – 2021-09-08 (×3): qty 2, 28d supply, fill #1

## 2021-06-03 MED ORDER — FREESTYLE LITE W/DEVICE KIT
1.0000 | PACK | Freq: Three times a day (TID) | 0 refills | Status: AC
  Filled 2021-06-03: qty 1, 30d supply, fill #0

## 2021-06-03 MED ORDER — FREESTYLE LANCETS MISC
3 refills | Status: AC
  Filled 2021-06-03: qty 100, 25d supply, fill #0
  Filled 2022-01-16: qty 100, 25d supply, fill #1

## 2021-06-03 MED ORDER — FREESTYLE LITE TEST VI STRP
1.0000 | ORAL_STRIP | Freq: Four times a day (QID) | 11 refills | Status: AC
  Filled 2021-06-03: qty 100, 25d supply, fill #0
  Filled 2021-09-15: qty 100, 25d supply, fill #1
  Filled 2022-01-16: qty 100, 25d supply, fill #2

## 2021-06-03 MED ORDER — INSULIN PEN NEEDLE 32G X 4 MM MISC
2 refills | Status: AC
  Filled 2021-06-03: qty 100, 50d supply, fill #0
  Filled 2021-09-15: qty 100, 50d supply, fill #1
  Filled 2021-12-09: qty 100, 50d supply, fill #2

## 2021-06-03 NOTE — Progress Notes (Signed)
Patient ID: Dawn Young, female   DOB: 09-16-1980, 40 y.o.   MRN: [id number]           Reason for Appointment: Follow-up for Type 2 Diabetes  Referring PCP: Dr. Tedra Senegal   History of Present Illness:          Date of diagnosis of type 2 diabetes mellitus: 09/2019       Background history:  She had a random glucose of 175 in 2020 but her diagnosis was not made until 3/21 At that time she went to the urgent care center because of vaginal discharge and itching and was found to have glycosuria and blood sugar over 200 Lab glucose subsequently was 436 and baseline A1c 12.4   Recent history:   Most recent A1c is 9.1 done at [facility]   fructosamine is 348, was last 306  INSULIN regimen is: None recently  Non-insulin hypoglycemic drugs the patient is taking are: Ozempic 0.5 mg weekly  Current management, blood sugar patterns and problems identified: She has not been seen in follow-up for about a year  She has not had a meter since July and did not call for replacement Also has been not getting a refill on her NPH insulin that was prescribed since summer She ran out of Ozempic about a month ago Her lab blood sugars appear to be over 300 at times and 218 fasting She is not motivated to watch her diet with eating sweets and drinking regular soft drinks consistently Her weight appears to be coming down She does exercise when she can She is apparently scheduled for bariatric surgery on 11/16      Side effects from medications have been: None     Meal times are:  Breakfast is at 3 am Lunch: 2 pm  Dinner: 7-8 pm    Typical meal intake: Breakfast is eggs and sausage, lunch [place] burger, dinner salad with fish               Exercise: Walking on treadmill  15-30 min periodically  Glucose monitoring:  done 0 times a day         Glucometer:   Freestyle lite    Blood Glucose readings not available  30-day average blood sugar previously 216    Dietician visit, most recent:  03/12/20  Weight history: Previous range 260-287  Wt Readings from Last 3 Encounters:  06/03/21 255 lb 3.2 oz (115.8 kg)  02/26/21 254 lb (115.2 kg)  12/02/20 270 lb (122.5 kg)    Glycemic control:   Lab Results  Component Value Date   HGBA1C 10.1 (H) 06/03/2020   HGBA1C 11.2 (H) 02/06/2020   HGBA1C 10.2 (H) 12/19/2019   Lab Results  Component Value Date   MICROALBUR 5.2 (H) 06/03/2020   LDLCALC 80 03/19/2020   CREATININE 0.66 05/22/2021   Lab Results  Component Value Date   MICRALBCREAT 3.4 06/03/2020    Lab Results  Component Value Date   FRUCTOSAMINE 348 (H) 05/22/2021   FRUCTOSAMINE 306 (H) 03/19/2020    No visits with results within 1 Week(s) from this visit.  Latest known visit with results is:  Lab on 05/22/2021  Component Date Value Ref Range Status   Fructosamine 05/22/2021 348 (A)  0 - 285 umol/L Final   Comment: Published reference interval for apparently healthy subjects between age 23 and 101 is 1 - 285 umol/L and in a poorly controlled diabetic population is 228 - 563 umol/L with a mean of 396  umol/L.    Sodium 05/22/2021 135  135 - 145 mEq/L Final   Potassium 05/22/2021 4.1  3.5 - 5.1 mEq/L Final   Chloride 05/22/2021 101  96 - 112 mEq/L Final   CO2 05/22/2021 28  19 - 32 mEq/L Final   Glucose, Bld 05/22/2021 218 (A)  70 - 99 mg/dL Final   BUN 05/22/2021 8  6 - 23 mg/dL Final   Creatinine, Ser 05/22/2021 0.66  0.40 - 1.20 mg/dL Final   GFR 05/22/2021 111.02  >60.00 mL/min Final   Calculated using the CKD-EPI Creatinine Equation (2021)   Calcium 05/22/2021 9.4  8.4 - 10.5 mg/dL Final    Allergies as of 06/03/2021   No Known Allergies      Medication List        Accurate as of June 03, 2021  8:59 AM. If you have any questions, ask your nurse or doctor.          amLODipine 5 MG tablet Commonly known as: NORVASC Take 1 tablet (5 mg total) by mouth daily.   Bactroban Nasal 2 % Generic drug: mupirocin nasal ointment Apply to  each nostril at bedtime for 7 days   doxycycline 100 MG capsule Commonly known as: VIBRAMYCIN Take 1 capsule (100 mg total) by mouth 2 (two) times daily.   fluconazole 150 MG tablet Commonly known as: Diflucan Take 1 tablet (150 mg total) by mouth daily.   freestyle lancets SMARTSIG:1 Each Topical 4 Times Daily   FreeStyle Libre 2 Sensor Misc 2 Devices by Does not apply route every 14 (fourteen) days.   FREESTYLE LITE test strip Generic drug: glucose blood 1 each by Other route 4 (four) times daily.   HYDROcodone-acetaminophen 5-325 MG tablet Commonly known as: NORCO/VICODIN Take 1 tablet by mouth every 6 (six) hours as needed.   ibuprofen 800 MG tablet Commonly known as: ADVIL Take 1 tablet (800 mg total) by mouth 3 (three) times daily.   insulin NPH Human 100 UNIT/ML injection Commonly known as: HumuLIN N 12 U hs   HumuLIN N 100 UNIT/ML injection Generic drug: insulin NPH Human Inject 12 units into the skin at bedtime   metFORMIN 500 MG 24 hr tablet Commonly known as: GLUCOPHAGE-XR Take 4 tablets (2,000 mg total) by mouth daily with supper.   Ozempic (1 MG/DOSE) 2 MG/1.5ML Sopn Generic drug: Semaglutide (1 MG/DOSE) Inject 1 mg into the skin once a week.   Ozempic (1 MG/DOSE) 4 MG/3ML Sopn Generic drug: Semaglutide (1 MG/DOSE) Inject 1 mg into the skin once a week.        Allergies: No Known Allergies  Past Medical History:  Diagnosis Date   History of kidney stones    Hx of migraine headaches    Hx of migraines     Past Surgical History:  Procedure Laterality Date   BREAST REDUCTION SURGERY Bilateral 06/24/2017   Procedure: MAMMARY REDUCTION  (BREAST);  Surgeon: Crissie Reese, MD;  Location: [place];  Service: Plastics;  Laterality: Bilateral;   BREAST SURGERY Bilateral 05/2017   CESAREAN SECTION  03, 05, 06   x's 3   CHOLECYSTECTOMY  2007   ESOPHAGOGASTRODUODENOSCOPY N/A 03/02/2019   Procedure: ESOPHAGOGASTRODUODENOSCOPY (EGD);  Surgeon: Milus Banister, MD;  Location: [facility];  Service: Endoscopy;  Laterality: N/A;   EUS N/A 03/02/2019   Procedure: UPPER ENDOSCOPIC ULTRASOUND (EUS) RADIAL;  Surgeon: Milus Banister, MD;  Location: [facility];  Service: Endoscopy;  Laterality: N/A;   FINE NEEDLE ASPIRATION N/A 03/02/2019  Procedure: FINE NEEDLE ASPIRATION (FNA) LINEAR;  Surgeon: Milus Banister, MD;  Location: [facility];  Service: Endoscopy;  Laterality: N/A;   TUBAL LIGATION  2006    Family History  Problem Relation Age of Onset   Healthy Mother    Healthy Father    Gout Father    Healthy Maternal Grandmother    Healthy Maternal Grandfather    Healthy Paternal Grandmother    Healthy Paternal Grandfather    Colon cancer Neg Hx    Esophageal cancer Neg Hx    Diabetes Neg Hx     Social History:  reports that she has quit smoking. Her smoking use included cigarettes. She has a 0.50 pack-year smoking history. She has never used smokeless tobacco. She reports current alcohol use of about 2.0 standard drinks per week. She reports that she does not use drugs.   Review of Systems   Lipid history: No history of hypercholesterolemia    Lab Results  Component Value Date   CHOL 146 03/19/2020   HDL 39.70 03/19/2020   LDLCALC 80 03/19/2020   TRIG 132.0 03/19/2020   CHOLHDL 4 03/19/2020           Hypertension: Has been treated with amlodipine previously but has not taken it for a while She is able to check it at work but not doing this lately  Microalbumin normal  BP Readings from Last 3 Encounters:  06/03/21 (!) 150/100  02/26/21 136/83  12/02/20 100/70     Most recent foot exam: 8/21  Currently known complications of diabetes: None  LABS:  No visits with results within 1 Week(s) from this visit.  Latest known visit with results is:  Lab on 05/22/2021  Component Date Value Ref Range Status   Fructosamine 05/22/2021 348 (A)  0 - 285 umol/L Final   Comment: Published reference interval for apparently  healthy subjects between age 39 and 44 is 75 - 285 umol/L and in a poorly controlled diabetic population is 228 - 563 umol/L with a mean of 396 umol/L.    Sodium 05/22/2021 135  135 - 145 mEq/L Final   Potassium 05/22/2021 4.1  3.5 - 5.1 mEq/L Final   Chloride 05/22/2021 101  96 - 112 mEq/L Final   CO2 05/22/2021 28  19 - 32 mEq/L Final   Glucose, Bld 05/22/2021 218 (A)  70 - 99 mg/dL Final   BUN 05/22/2021 8  6 - 23 mg/dL Final   Creatinine, Ser 05/22/2021 0.66  0.40 - 1.20 mg/dL Final   GFR 05/22/2021 111.02  >60.00 mL/min Final   Calculated using the CKD-EPI Creatinine Equation (2021)   Calcium 05/22/2021 9.4  8.4 - 10.5 mg/dL Final    Physical Examination:  BP (!) 150/100   Pulse 77   Ht 5\' 5"  (1.651 m)   Wt 255 lb 3.2 oz (115.8 kg)   SpO2 99%   BMI 42.47 kg/m    ASSESSMENT:  Diabetes type 2 with morbid obesity, associated with acanthosis  See history of present illness for detailed discussion of current diabetes management, blood sugar patterns and problems identified  Her A1c is 9.1 and her serum fructosamine is high  As discussed above she has not taken insulin for a while and Ozempic for a month Not checking her blood sugars Also going off her diet Surprisingly has lost weight and may have been benefiting from the 1 mg Ozempic  Now she is scheduled for bariatric surgery likely will not need insulin long-term However to  get her ready for surgery she should have control of her hyperglycemia temporarily with premixed insulin twice a day which would be better than NPH at night Blood sugars appear to be higher during the days than fasting as judged by her labs   PLAN:    Ozempic to be stopped, a new prescription will not be sent Discussed types of insulin and for now since she is significantly hyperglycemic will go on 70/30 insulin twice a day She will start with 16 in the morning and 12 at suppertime and discussed the timing of the shot She can go up another 4  units at the end of the week if needed if blood sugars are still well over 200 Discussed cutting out regular soft drinks and sweets She will start using a regular meter to check her sugar but also will prescribe the [place] 3 if this is available on her insurance Needs more regular follow-up She will discuss her blood pressure results with her PCP, to start monitoring daily at work or home    There are no Patient Instructions on file for this visit.   Total visit time including counseling = 30 minutes   Elayne Snare 06/03/2021, 8:59 AM   Note: This office note was prepared with Dragon voice recognition system technology. Any transcriptional errors that result from this process are unintentional.

## 2021-06-03 NOTE — Patient Instructions (Addendum)
70/30 before Bfst 16 units and 12 before supper   Add 4 more if sugar >250  Check blood sugars on waking up 6-7 days a week  Also check blood sugars about 2 hours after meals and do this after different meals by rotation  Recommended blood sugar levels on waking up are 90-130 and about 2 hours after meal is 130-180  Please bring your blood sugar monitor to each visit, thank you  Check BP today

## 2021-06-04 ENCOUNTER — Other Ambulatory Visit (HOSPITAL_COMMUNITY): Payer: Self-pay

## 2021-06-10 DIAGNOSIS — Z01818 Encounter for other preprocedural examination: Secondary | ICD-10-CM | POA: Diagnosis not present

## 2021-06-10 DIAGNOSIS — Z20822 Contact with and (suspected) exposure to covid-19: Secondary | ICD-10-CM | POA: Diagnosis not present

## 2021-06-11 DIAGNOSIS — R918 Other nonspecific abnormal finding of lung field: Secondary | ICD-10-CM | POA: Diagnosis not present

## 2021-06-11 DIAGNOSIS — L298 Other pruritus: Secondary | ICD-10-CM | POA: Diagnosis not present

## 2021-06-11 DIAGNOSIS — K861 Other chronic pancreatitis: Secondary | ICD-10-CM | POA: Diagnosis not present

## 2021-06-11 DIAGNOSIS — I10 Essential (primary) hypertension: Secondary | ICD-10-CM | POA: Diagnosis not present

## 2021-06-11 DIAGNOSIS — K8689 Other specified diseases of pancreas: Secondary | ICD-10-CM | POA: Diagnosis not present

## 2021-06-11 DIAGNOSIS — D3A8 Other benign neuroendocrine tumors: Secondary | ICD-10-CM | POA: Diagnosis not present

## 2021-06-11 DIAGNOSIS — T402X5A Adverse effect of other opioids, initial encounter: Secondary | ICD-10-CM | POA: Diagnosis not present

## 2021-06-11 DIAGNOSIS — Z794 Long term (current) use of insulin: Secondary | ICD-10-CM | POA: Diagnosis not present

## 2021-06-11 DIAGNOSIS — G8918 Other acute postprocedural pain: Secondary | ICD-10-CM | POA: Diagnosis not present

## 2021-06-11 DIAGNOSIS — Z90411 Acquired partial absence of pancreas: Secondary | ICD-10-CM | POA: Diagnosis not present

## 2021-06-11 DIAGNOSIS — Z87891 Personal history of nicotine dependence: Secondary | ICD-10-CM | POA: Diagnosis not present

## 2021-06-11 DIAGNOSIS — E119 Type 2 diabetes mellitus without complications: Secondary | ICD-10-CM | POA: Diagnosis not present

## 2021-06-13 DIAGNOSIS — K8689 Other specified diseases of pancreas: Secondary | ICD-10-CM | POA: Diagnosis not present

## 2021-06-13 DIAGNOSIS — G8918 Other acute postprocedural pain: Secondary | ICD-10-CM | POA: Diagnosis not present

## 2021-06-13 DIAGNOSIS — E119 Type 2 diabetes mellitus without complications: Secondary | ICD-10-CM | POA: Diagnosis not present

## 2021-06-13 DIAGNOSIS — Z794 Long term (current) use of insulin: Secondary | ICD-10-CM | POA: Diagnosis not present

## 2021-06-14 DIAGNOSIS — Z794 Long term (current) use of insulin: Secondary | ICD-10-CM | POA: Diagnosis not present

## 2021-06-14 DIAGNOSIS — R918 Other nonspecific abnormal finding of lung field: Secondary | ICD-10-CM | POA: Diagnosis not present

## 2021-06-14 DIAGNOSIS — G8918 Other acute postprocedural pain: Secondary | ICD-10-CM | POA: Diagnosis not present

## 2021-06-14 DIAGNOSIS — E119 Type 2 diabetes mellitus without complications: Secondary | ICD-10-CM | POA: Diagnosis not present

## 2021-06-15 DIAGNOSIS — Z90411 Acquired partial absence of pancreas: Secondary | ICD-10-CM | POA: Diagnosis not present

## 2021-06-15 DIAGNOSIS — K8689 Other specified diseases of pancreas: Secondary | ICD-10-CM | POA: Diagnosis not present

## 2021-06-23 DIAGNOSIS — D3A8 Other benign neuroendocrine tumors: Secondary | ICD-10-CM | POA: Diagnosis not present

## 2021-06-24 DIAGNOSIS — C254 Malignant neoplasm of endocrine pancreas: Secondary | ICD-10-CM | POA: Diagnosis not present

## 2021-06-24 DIAGNOSIS — Z9081 Acquired absence of spleen: Secondary | ICD-10-CM | POA: Diagnosis not present

## 2021-06-24 DIAGNOSIS — J90 Pleural effusion, not elsewhere classified: Secondary | ICD-10-CM | POA: Diagnosis not present

## 2021-06-24 DIAGNOSIS — D3A8 Other benign neuroendocrine tumors: Secondary | ICD-10-CM | POA: Diagnosis not present

## 2021-06-24 DIAGNOSIS — J9811 Atelectasis: Secondary | ICD-10-CM | POA: Diagnosis not present

## 2021-06-24 DIAGNOSIS — Z90411 Acquired partial absence of pancreas: Secondary | ICD-10-CM | POA: Diagnosis not present

## 2021-06-24 DIAGNOSIS — K760 Fatty (change of) liver, not elsewhere classified: Secondary | ICD-10-CM | POA: Diagnosis not present

## 2021-06-24 DIAGNOSIS — Z01818 Encounter for other preprocedural examination: Secondary | ICD-10-CM | POA: Diagnosis not present

## 2021-06-25 ENCOUNTER — Encounter: Payer: Self-pay | Admitting: *Deleted

## 2021-06-25 ENCOUNTER — Other Ambulatory Visit: Payer: Self-pay | Admitting: *Deleted

## 2021-06-25 NOTE — Patient Outreach (Signed)
[facility]) Care Management  06/25/2021  Dawn Young [birth year] [id number]   Transition of care telephone call Referral received: 11/23  Initial outreach: 11/30 (Delayed due to Holiday) Surgery/procedure date: Pancreatitis [place] Insurance: [place]   Initial telephone call was unsuccessful to patient's listed telephone number, no answer received. RN left a HIPAA compliant voicemail message with contact information, requesting a return call.   Will attempt another outreach ober the next week for pending inquires.  Raina Mina, RN Care Management Coordinator [place] Office [phone]

## 2021-06-30 ENCOUNTER — Other Ambulatory Visit: Payer: Self-pay | Admitting: *Deleted

## 2021-06-30 ENCOUNTER — Other Ambulatory Visit (HOSPITAL_COMMUNITY): Payer: Self-pay

## 2021-06-30 NOTE — Patient Outreach (Signed)
[place] [facility]) Care Management  06/30/2021  Dawn Young 1980-08-16 [id number]   Transition of care Outreach #2  RN attempted outreach call to pt today however unsuccessful. RN able to leave HIPAA approved voice message requesting a call back.  Will rescheduled another outreach call over the next week for services.  Raina Mina, RN Care Management Coordinator [place] Office [phone]

## 2021-07-01 DIAGNOSIS — R7989 Other specified abnormal findings of blood chemistry: Secondary | ICD-10-CM | POA: Diagnosis not present

## 2021-07-01 DIAGNOSIS — Z01818 Encounter for other preprocedural examination: Secondary | ICD-10-CM | POA: Diagnosis not present

## 2021-07-01 DIAGNOSIS — E611 Iron deficiency: Secondary | ICD-10-CM | POA: Diagnosis not present

## 2021-07-01 DIAGNOSIS — R7309 Other abnormal glucose: Secondary | ICD-10-CM | POA: Diagnosis not present

## 2021-07-01 DIAGNOSIS — D3A8 Other benign neuroendocrine tumors: Secondary | ICD-10-CM | POA: Diagnosis not present

## 2021-07-01 DIAGNOSIS — K8689 Other specified diseases of pancreas: Secondary | ICD-10-CM | POA: Diagnosis not present

## 2021-07-02 ENCOUNTER — Other Ambulatory Visit (HOSPITAL_COMMUNITY): Payer: Self-pay

## 2021-07-03 ENCOUNTER — Other Ambulatory Visit: Payer: Self-pay | Admitting: *Deleted

## 2021-07-03 NOTE — Patient Outreach (Signed)
[facility]) Care Management  07/03/2021  Dawn Young 06/13/1981 [id number]   Transition of care telephone call Outreach: #3 Insurance: [place] via telephone call was unsuccessful to patient's listed telephone number. RN left a HIPAA compliant voicemail message with contact information, requesting a return call.   Will attempted another outreach call over the next month for pending services and inquires.   Raina Mina, RN Care Management Coordinator [place] Office [phone]

## 2021-07-08 ENCOUNTER — Other Ambulatory Visit (HOSPITAL_COMMUNITY): Payer: Self-pay

## 2021-07-08 ENCOUNTER — Telehealth: Payer: Self-pay | Admitting: Internal Medicine

## 2021-07-08 DIAGNOSIS — I10 Essential (primary) hypertension: Secondary | ICD-10-CM

## 2021-07-08 DIAGNOSIS — D3A8 Other benign neuroendocrine tumors: Secondary | ICD-10-CM | POA: Diagnosis not present

## 2021-07-08 MED ORDER — AMLODIPINE BESYLATE 5 MG PO TABS
5.0000 mg | ORAL_TABLET | Freq: Every day | ORAL | 0 refills | Status: AC
  Filled 2021-07-08: qty 30, 30d supply, fill #0

## 2021-07-08 NOTE — Telephone Encounter (Signed)
Called and left a message with below doctors office to see if they could refer patient to neurology since they have seen patient for the headaches and we have not.  Mardene Celeste, [facility] [address] [place] [address]   Phone: [phone] Fax: [phone]

## 2021-07-08 NOTE — Telephone Encounter (Signed)
Dawn Young [id number]  [facility] called to say she has been having headaches ever since the day after her pancreatic surgery. Her surgeon does not think it has anything to do with the surgery and has told her to follow up with her PCP. I let her know you were out of office until Thursday and we would not have any appointments until next week. She does not have a neurologist.

## 2021-07-08 NOTE — Telephone Encounter (Signed)
Medication sent in. Notification of appointment needed.

## 2021-07-09 ENCOUNTER — Other Ambulatory Visit (HOSPITAL_COMMUNITY): Payer: Self-pay

## 2021-07-14 DIAGNOSIS — K8689 Other specified diseases of pancreas: Secondary | ICD-10-CM | POA: Diagnosis not present

## 2021-07-14 DIAGNOSIS — D3A8 Other benign neuroendocrine tumors: Secondary | ICD-10-CM | POA: Diagnosis not present

## 2021-07-15 ENCOUNTER — Ambulatory Visit: Payer: 59 | Admitting: Endocrinology

## 2021-08-04 ENCOUNTER — Other Ambulatory Visit: Payer: Self-pay | Admitting: *Deleted

## 2021-08-04 NOTE — Patient Outreach (Signed)
[facility]) Care Management  08/04/2021  Dawn Young [birth year] [id number]   Transition of care telephone call Unsuccessful Outreach #4: Insurance: [place]  Telephone call was unsuccessful to patient's listed telephone number, no answer received. RN left a HIPAA compliant voicemail message with contact information, requesting a return call.   Cased upon the unsuccessful call attempts will close this case and notify the provider.  Raina Mina, RN Care Management Coordinator [place] Office [phone]

## 2021-08-28 ENCOUNTER — Other Ambulatory Visit (HOSPITAL_COMMUNITY): Payer: Self-pay

## 2021-09-04 ENCOUNTER — Ambulatory Visit: Payer: 59 | Admitting: Endocrinology

## 2021-09-05 ENCOUNTER — Other Ambulatory Visit (HOSPITAL_COMMUNITY): Payer: Self-pay

## 2021-09-08 ENCOUNTER — Other Ambulatory Visit (HOSPITAL_COMMUNITY): Payer: Self-pay

## 2021-09-09 ENCOUNTER — Other Ambulatory Visit (HOSPITAL_COMMUNITY): Payer: Self-pay

## 2021-09-10 ENCOUNTER — Other Ambulatory Visit: Payer: Self-pay

## 2021-09-10 ENCOUNTER — Other Ambulatory Visit (HOSPITAL_COMMUNITY): Payer: Self-pay

## 2021-09-10 ENCOUNTER — Ambulatory Visit (INDEPENDENT_AMBULATORY_CARE_PROVIDER_SITE_OTHER): Payer: 59 | Admitting: Endocrinology

## 2021-09-10 ENCOUNTER — Encounter: Payer: Self-pay | Admitting: Endocrinology

## 2021-09-10 VITALS — BP 124/90 | HR 64 | Ht 65.0 in | Wt 240.8 lb

## 2021-09-10 DIAGNOSIS — I10 Essential (primary) hypertension: Secondary | ICD-10-CM | POA: Diagnosis not present

## 2021-09-10 DIAGNOSIS — E1165 Type 2 diabetes mellitus with hyperglycemia: Secondary | ICD-10-CM

## 2021-09-10 DIAGNOSIS — Z794 Long term (current) use of insulin: Secondary | ICD-10-CM | POA: Diagnosis not present

## 2021-09-10 MED ORDER — METFORMIN HCL ER 500 MG PO TB24
1000.0000 mg | ORAL_TABLET | Freq: Two times a day (BID) | ORAL | 1 refills | Status: AC
  Filled 2021-09-10: qty 120, 30d supply, fill #0
  Filled 2022-02-26: qty 120, 30d supply, fill #1

## 2021-09-10 MED ORDER — OZEMPIC (0.25 OR 0.5 MG/DOSE) 2 MG/1.5ML ~~LOC~~ SOPN
0.5000 mg | PEN_INJECTOR | SUBCUTANEOUS | 2 refills | Status: DC
  Filled 2021-09-10: qty 1.5, 28d supply, fill #0
  Filled 2021-11-01: qty 1.5, 28d supply, fill #1

## 2021-09-10 NOTE — Progress Notes (Signed)
Patient ID: Dawn Young, female   DOB: 08-04-1980, 41 y.o.   MRN: [id number]           Reason for Appointment: Follow-up for Type 2 Diabetes  Referring PCP: Dr. Tedra Senegal   History of Present Illness:          Date of diagnosis of type 2 diabetes mellitus: 09/2019       Background history:  She had a random glucose of 175 in 2020 but her diagnosis was not made until 3/21 At that time she went to the urgent care center because of vaginal discharge and itching and was found to have glycosuria and blood sugar over 200 Lab glucose subsequently was 436 and baseline A1c 12.4   Recent history:   Most recent A1c is 9.9 done at [facility] in 12/22   fructosamine is 348, was last 306  INSULIN regimen is: Humalog 3 to 10 units before meals, Lantus 15 units in the morning  Non-insulin hypoglycemic drugs the patient is taking are: None, previously Ozempic 0.5 mg weekly  Current management, blood sugar patterns and problems identified: She has not been able to get her bariatric surgery that was planned previously She was started on 70/30 insulin on her last visit but this was changed to generic Lantus and Humalog She has persistently high readings averaging 320 recently In general she has monitored blood sugars only sporadically either morning or evening  Only occasionally she will have better readings in the mornings or late afternoon especially after taking extra Humalog  Blood sugars have been as high as 400 or 500 before supper or bedtime and sometimes this may be related to not taking her Humalog at lunch  She thinks she is taking her Lantus consistently in the morning but does not increase the dose despite high fasting readings Her weight again has  been coming down She does exercise with going to the gym at work or walking      Side effects from medications have been: None     Meal times are:  Breakfast is at 3 am Lunch: 2 pm  Dinner: 7-8 pm    Typical meal intake: Breakfast is  eggs and sausage, lunch [place] burger, dinner salad with fish               Glucose monitoring: As below       glucometer:   Freestyle lite    Blood Glucose readings    PRE-MEAL Fasting Lunch Dinner Bedtime Overall  Glucose range: 97-364    97-500  Mean/median: [phone] 546 270    Dietician visit, most recent: 03/12/20  Weight history: Previous range 260-287  Wt Readings from Last 3 Encounters:  09/10/21 240 lb 12.8 oz (109.2 kg)  06/03/21 255 lb 3.2 oz (115.8 kg)  02/26/21 254 lb (115.2 kg)    Glycemic control:   Lab Results  Component Value Date   HGBA1C 10.1 (H) 06/03/2020   HGBA1C 11.2 (H) 02/06/2020   HGBA1C 10.2 (H) 12/19/2019   Lab Results  Component Value Date   MICROALBUR 5.2 (H) 06/03/2020   LDLCALC 80 03/19/2020   CREATININE 0.66 05/22/2021   Lab Results  Component Value Date   MICRALBCREAT 3.4 06/03/2020    Lab Results  Component Value Date   FRUCTOSAMINE 348 (H) 05/22/2021   FRUCTOSAMINE 306 (H) 03/19/2020    No visits with results within 1 Week(s) from this visit.  Latest known visit with results is:  Lab on 05/22/2021  Component Date Value Ref Range Status   Fructosamine 05/22/2021 348 (H)  0 - 285 umol/L Final   Comment: Published reference interval for apparently healthy subjects between age 11 and 74 is 23 - 285 umol/L and in a poorly controlled diabetic population is 228 - 563 umol/L with a mean of 396 umol/L.    Sodium 05/22/2021 135  135 - 145 mEq/L Final   Potassium 05/22/2021 4.1  3.5 - 5.1 mEq/L Final   Chloride 05/22/2021 101  96 - 112 mEq/L Final   CO2 05/22/2021 28  19 - 32 mEq/L Final   Glucose, Bld 05/22/2021 218 (H)  70 - 99 mg/dL Final   BUN 05/22/2021 8  6 - 23 mg/dL Final   Creatinine, Ser 05/22/2021 0.66  0.40 - 1.20 mg/dL Final   GFR 05/22/2021 111.02  >60.00 mL/min Final   Calculated using the CKD-EPI Creatinine Equation (2021)   Calcium 05/22/2021 9.4  8.4 - 10.5 mg/dL Final    Allergies as of 09/10/2021   No  Known Allergies      Medication List        Accurate as of September 10, 2021  9:35 AM. If you have any questions, ask your nurse or doctor.          STOP taking these medications    HumuLIN 70/30 KwikPen (70-30) 100 UNIT/ML KwikPen Generic drug: insulin isophane & regular human KwikPen Stopped by: Elayne Snare, MD       TAKE these medications    amLODipine 5 MG tablet Commonly known as: NORVASC Take 1 tablet (5 mg total) by mouth daily. Needs an appointment.   Bactroban Nasal 2 % Generic drug: mupirocin nasal ointment Apply to each nostril at bedtime for 7 days   fluconazole 150 MG tablet Commonly known as: Diflucan Take 1 tablet (150 mg total) by mouth daily.   freestyle lancets Check blood sugar 3-4 times a day   FreeStyle Libre 3 Sensor Misc Place 1 sensor on the skin on back of upper arm every 14 days. Use to check glucose continuously.   FREESTYLE LITE test strip Generic drug: glucose blood Use to check blood sugar 4 (four) times daily.   FreeStyle Lite w/Device Kit Use to test blood sugar in the morning, at noon, and at bedtime.   HumaLOG KwikPen 200 UNIT/ML KwikPen Generic drug: insulin lispro Inject into the skin. 3-10 units before meal   ibuprofen 800 MG tablet Commonly known as: ADVIL Take 1 tablet (800 mg total) by mouth 3 (three) times daily.   insulin glargine-yfgn 100 UNIT/ML Pen Commonly known as: SEMGLEE Inject 15 Units into the skin daily.   metFORMIN 500 MG 24 hr tablet Commonly known as: GLUCOPHAGE-XR Take 1,000 mg by mouth 2 (two) times daily.   Unifine Pentips 32G X 4 MM Misc Generic drug: Insulin Pen Needle Use to inject insulin twice a day        Allergies: No Known Allergies  Past Medical History:  Diagnosis Date   History of kidney stones    Hx of migraine headaches    Hx of migraines     Past Surgical History:  Procedure Laterality Date   BREAST REDUCTION SURGERY Bilateral 06/24/2017   Procedure: MAMMARY  REDUCTION  (BREAST);  Surgeon: Crissie Reese, MD;  Location: [facility];  Service: Plastics;  Laterality: Bilateral;   BREAST SURGERY Bilateral 05/2017   CESAREAN SECTION  03, 05, 06   x's 3   CHOLECYSTECTOMY  2007   ESOPHAGOGASTRODUODENOSCOPY N/A  03/02/2019   Procedure: ESOPHAGOGASTRODUODENOSCOPY (EGD);  Surgeon: Milus Banister, MD;  Location: [facility];  Service: Endoscopy;  Laterality: N/A;   EUS N/A 03/02/2019   Procedure: UPPER ENDOSCOPIC ULTRASOUND (EUS) RADIAL;  Surgeon: Milus Banister, MD;  Location: [facility];  Service: Endoscopy;  Laterality: N/A;   FINE NEEDLE ASPIRATION N/A 03/02/2019   Procedure: FINE NEEDLE ASPIRATION (FNA) LINEAR;  Surgeon: Milus Banister, MD;  Location: [facility];  Service: Endoscopy;  Laterality: N/A;   TUBAL LIGATION  2006    Family History  Problem Relation Age of Onset   Healthy Mother    Healthy Father    Gout Father    Healthy Maternal Grandmother    Healthy Maternal Grandfather    Healthy Paternal Grandmother    Healthy Paternal Grandfather    Colon cancer Neg Hx    Esophageal cancer Neg Hx    Diabetes Neg Hx     Social History:  reports that she has quit smoking. Her smoking use included cigarettes. She has a 0.50 pack-year smoking history. She has never used smokeless tobacco. She reports current alcohol use of about 2.0 standard drinks per week. She reports that she does not use drugs.   Review of Systems   Lipid history: No history of hypercholesterolemia No recent labs available    Lab Results  Component Value Date   CHOL 146 03/19/2020   HDL 39.70 03/19/2020   LDLCALC 80 03/19/2020   TRIG 132.0 03/19/2020   CHOLHDL 4 03/19/2020           Hypertension: Has been treated with amlodipine previously but has not taken She is able to check it at work  She thinks her blood pressure recently is in the 120/80s  Microalbumin normal  BP Readings from Last 3 Encounters:  09/10/21 124/90  06/03/21 (!) 150/100  02/26/21 136/83      Most recent foot exam: 8/21  Currently known complications of diabetes: None  LABS:  No visits with results within 1 Week(s) from this visit.  Latest known visit with results is:  Lab on 05/22/2021  Component Date Value Ref Range Status   Fructosamine 05/22/2021 348 (H)  0 - 285 umol/L Final   Comment: Published reference interval for apparently healthy subjects between age 51 and 96 is 13 - 285 umol/L and in a poorly controlled diabetic population is 228 - 563 umol/L with a mean of 396 umol/L.    Sodium 05/22/2021 135  135 - 145 mEq/L Final   Potassium 05/22/2021 4.1  3.5 - 5.1 mEq/L Final   Chloride 05/22/2021 101  96 - 112 mEq/L Final   CO2 05/22/2021 28  19 - 32 mEq/L Final   Glucose, Bld 05/22/2021 218 (H)  70 - 99 mg/dL Final   BUN 05/22/2021 8  6 - 23 mg/dL Final   Creatinine, Ser 05/22/2021 0.66  0.40 - 1.20 mg/dL Final   GFR 05/22/2021 111.02  >60.00 mL/min Final   Calculated using the CKD-EPI Creatinine Equation (2021)   Calcium 05/22/2021 9.4  8.4 - 10.5 mg/dL Final    Physical Examination:  BP 124/90    Pulse 64    Ht _0  (1.651 m)    Wt 240 lb 12.8 oz (109.2 kg)    SpO2 99%    BMI 40.07 kg/m    ASSESSMENT:  Diabetes type 2 with morbid obesity, associated with acanthosis  See history of present illness for detailed discussion of current diabetes management, blood sugar patterns and  problems identified  Her A1c is 9.9 about 6 weeks ago  She has not been regular with her follow-up She is getting only small doses of insulin from her 76 physicians recently Despite her weight loss she is still quite insulin deficient and blood sugars are over 300 average  Unclear when she will be getting bariatric surgery She does need to be on basal bolus insulin regimen with significantly higher amounts of insulin especially at meals Also may benefit from restarting Ozempic   PLAN:    Ozempic to be restarted at 0.25 once and then 0.5 weekly until her next  visit RESTART metformin as before 1000 mg twice daily LANTUS insulin will be increased to 22 units  Discussed how basal insulin works, timing of injection, dosage, injection sites.   Also discussed titration based on fasting blood sugar every 3 days by 2 units and at target of 90-130 for fasting reading.  Given a flowsheet with instructions on how to keep a record and adjust the doses every 3 days  Mealtime insulin will be basic amount of 10 units in the morning, 8 before lunch and 12 at dinner with additional for high readings as before Discussed blood sugar targets She will get her prescription filled for the freestyle libre 3 and start using it, discussed using the app on her phone for this She will follow-up with her PCP for hypertension Needs follow-up lipids  Total visit time including counseling = 30 minutes  Patient Instructions  Humalog 10 units in am 8 units lunch and 12 at supper + add sliding scale  Ozempic 0.25 mg for 1 shot then 0.5     Total visit time including counseling = 30 minutes   Elayne Snare 09/10/2021, 9:35 AM   Note: This office note was prepared with Dragon voice recognition system technology. Any transcriptional errors that result from this process are unintentional.

## 2021-09-10 NOTE — Patient Instructions (Signed)
Humalog 10 units in am 8 units lunch and 12 at supper + add sliding scale  Ozempic 0.25 mg for 1 shot then 0.5

## 2021-09-15 ENCOUNTER — Other Ambulatory Visit: Payer: Self-pay | Admitting: Internal Medicine

## 2021-09-15 ENCOUNTER — Encounter: Payer: Self-pay | Admitting: Endocrinology

## 2021-09-15 ENCOUNTER — Other Ambulatory Visit (HOSPITAL_COMMUNITY): Payer: Self-pay

## 2021-09-15 DIAGNOSIS — E1165 Type 2 diabetes mellitus with hyperglycemia: Secondary | ICD-10-CM

## 2021-09-15 DIAGNOSIS — Z794 Long term (current) use of insulin: Secondary | ICD-10-CM

## 2021-09-15 MED ORDER — HUMALOG KWIKPEN 200 UNIT/ML ~~LOC~~ SOPN
PEN_INJECTOR | SUBCUTANEOUS | 2 refills | Status: AC
  Filled 2021-09-15: qty 9, 90d supply, fill #0
  Filled 2021-11-01: qty 9, 90d supply, fill #1
  Filled 2021-12-09: qty 9, 60d supply, fill #1

## 2021-09-16 ENCOUNTER — Other Ambulatory Visit (HOSPITAL_COMMUNITY): Payer: Self-pay

## 2021-10-01 DIAGNOSIS — E119 Type 2 diabetes mellitus without complications: Secondary | ICD-10-CM | POA: Diagnosis not present

## 2021-10-09 ENCOUNTER — Other Ambulatory Visit (HOSPITAL_COMMUNITY): Payer: Self-pay | Admitting: General Surgery

## 2021-10-09 ENCOUNTER — Other Ambulatory Visit: Payer: Self-pay | Admitting: General Surgery

## 2021-10-15 ENCOUNTER — Other Ambulatory Visit: Payer: 59

## 2021-10-16 ENCOUNTER — Other Ambulatory Visit: Payer: 59

## 2021-10-20 DIAGNOSIS — Z130 Encounter for screening for diseases of the blood and blood-forming organs and certain disorders involving the immune mechanism: Secondary | ICD-10-CM | POA: Diagnosis not present

## 2021-10-20 DIAGNOSIS — N920 Excessive and frequent menstruation with regular cycle: Secondary | ICD-10-CM | POA: Diagnosis not present

## 2021-10-20 DIAGNOSIS — Z01419 Encounter for gynecological examination (general) (routine) without abnormal findings: Secondary | ICD-10-CM | POA: Diagnosis not present

## 2021-10-20 DIAGNOSIS — Z6841 Body Mass Index (BMI) 40.0 and over, adult: Secondary | ICD-10-CM | POA: Diagnosis not present

## 2021-10-20 DIAGNOSIS — Z124 Encounter for screening for malignant neoplasm of cervix: Secondary | ICD-10-CM | POA: Diagnosis not present

## 2021-10-20 DIAGNOSIS — Z1389 Encounter for screening for other disorder: Secondary | ICD-10-CM | POA: Diagnosis not present

## 2021-10-23 ENCOUNTER — Ambulatory Visit: Payer: 59 | Admitting: Endocrinology

## 2021-10-27 ENCOUNTER — Ambulatory Visit (HOSPITAL_COMMUNITY): Payer: 59 | Attending: General Surgery

## 2021-10-27 ENCOUNTER — Other Ambulatory Visit (HOSPITAL_COMMUNITY): Payer: Self-pay

## 2021-10-27 ENCOUNTER — Ambulatory Visit (HOSPITAL_COMMUNITY): Admission: RE | Admit: 2021-10-27 | Payer: 59 | Source: Ambulatory Visit

## 2021-10-27 ENCOUNTER — Encounter (HOSPITAL_COMMUNITY): Payer: Self-pay

## 2021-10-27 DIAGNOSIS — N920 Excessive and frequent menstruation with regular cycle: Secondary | ICD-10-CM | POA: Diagnosis not present

## 2021-10-27 MED ORDER — NORETHINDRONE 0.35 MG PO TABS
ORAL_TABLET | ORAL | 12 refills | Status: AC
  Filled 2021-10-27: qty 28, 28d supply, fill #0
  Filled 2021-12-02: qty 28, 28d supply, fill #1

## 2021-11-01 ENCOUNTER — Other Ambulatory Visit (HOSPITAL_COMMUNITY): Payer: Self-pay

## 2021-11-03 ENCOUNTER — Other Ambulatory Visit (HOSPITAL_COMMUNITY): Payer: Self-pay

## 2021-11-03 MED ORDER — OZEMPIC (0.25 OR 0.5 MG/DOSE) 2 MG/3ML ~~LOC~~ SOPN
PEN_INJECTOR | SUBCUTANEOUS | 1 refills | Status: AC
  Filled 2021-11-03: qty 3, 28d supply, fill #0
  Filled 2022-01-28: qty 3, 28d supply, fill #1

## 2021-11-07 ENCOUNTER — Encounter: Payer: Self-pay | Admitting: Endocrinology

## 2021-11-07 DIAGNOSIS — E1165 Type 2 diabetes mellitus with hyperglycemia: Secondary | ICD-10-CM

## 2021-11-10 ENCOUNTER — Other Ambulatory Visit: Payer: Self-pay | Admitting: Endocrinology

## 2021-11-10 DIAGNOSIS — E1165 Type 2 diabetes mellitus with hyperglycemia: Secondary | ICD-10-CM

## 2021-11-10 MED ORDER — INSULIN GLARGINE-YFGN 100 UNIT/ML ~~LOC~~ SOPN: PEN_INJECTOR | SUBCUTANEOUS | 0 refills | Status: DC

## 2021-11-10 NOTE — Addendum Note (Signed)
Addended by: Cinda Quest on: 11/10/2021 08:20 AM ? ? Modules accepted: Orders ? ?

## 2021-11-14 ENCOUNTER — Other Ambulatory Visit (HOSPITAL_COMMUNITY): Payer: Self-pay

## 2021-11-14 MED ORDER — INSULIN GLARGINE-YFGN 100 UNIT/ML ~~LOC~~ SOPN
PEN_INJECTOR | SUBCUTANEOUS | 0 refills | Status: DC
  Filled 2021-11-14: qty 15, 90d supply, fill #0

## 2021-11-14 NOTE — Addendum Note (Signed)
Addended by: Cinda Quest on: 11/14/2021 10:21 AM ? ? Modules accepted: Orders ? ?

## 2021-11-19 ENCOUNTER — Ambulatory Visit: Payer: 59 | Admitting: Skilled Nursing Facility1

## 2021-11-28 ENCOUNTER — Other Ambulatory Visit: Payer: 59

## 2021-11-28 DIAGNOSIS — R5383 Other fatigue: Secondary | ICD-10-CM

## 2021-11-28 DIAGNOSIS — I10 Essential (primary) hypertension: Secondary | ICD-10-CM

## 2021-11-28 DIAGNOSIS — E1165 Type 2 diabetes mellitus with hyperglycemia: Secondary | ICD-10-CM

## 2021-11-28 DIAGNOSIS — D500 Iron deficiency anemia secondary to blood loss (chronic): Secondary | ICD-10-CM

## 2021-11-28 DIAGNOSIS — E559 Vitamin D deficiency, unspecified: Secondary | ICD-10-CM

## 2021-12-01 ENCOUNTER — Encounter: Payer: Self-pay | Admitting: Internal Medicine

## 2021-12-02 ENCOUNTER — Other Ambulatory Visit (HOSPITAL_COMMUNITY): Payer: Self-pay

## 2021-12-02 ENCOUNTER — Encounter: Payer: Self-pay | Admitting: Internal Medicine

## 2021-12-10 ENCOUNTER — Other Ambulatory Visit (HOSPITAL_COMMUNITY): Payer: Self-pay

## 2022-01-14 IMAGING — MG DIGITAL DIAGNOSTIC BILAT W/ TOMO W/ CAD
7 of 12 series · 8 of 36 positions shown · non-contrast
Comparison: None.

CLINICAL DATA: 40-year-old female with RIGHT breast redness, pain,
and swelling not improved on doxycycline for 5 days. Patient states
that a wound opened yesterday from the LOWER RIGHT breast and
drained a large amount of pus. Patient denies fever. History of
bilateral reductions several years ago. Baseline mammogram.

EXAM:
DIGITAL DIAGNOSTIC BILATERAL MAMMOGRAM WITH TOMOSYNTHESIS AND CAD;
ULTRASOUND LEFT BREAST LIMITED; ULTRASOUND RIGHT BREAST LIMITED
TECHNIQUE: Bilateral digital diagnostic mammography and breast tomosynthesis
was performed. The images were evaluated with computer-aided
detection.; Targeted ultrasound examination of the left breast was
performed; Targeted ultrasound examination of the right breast was
performed

[L MLO synth-2D (1 of 2)]
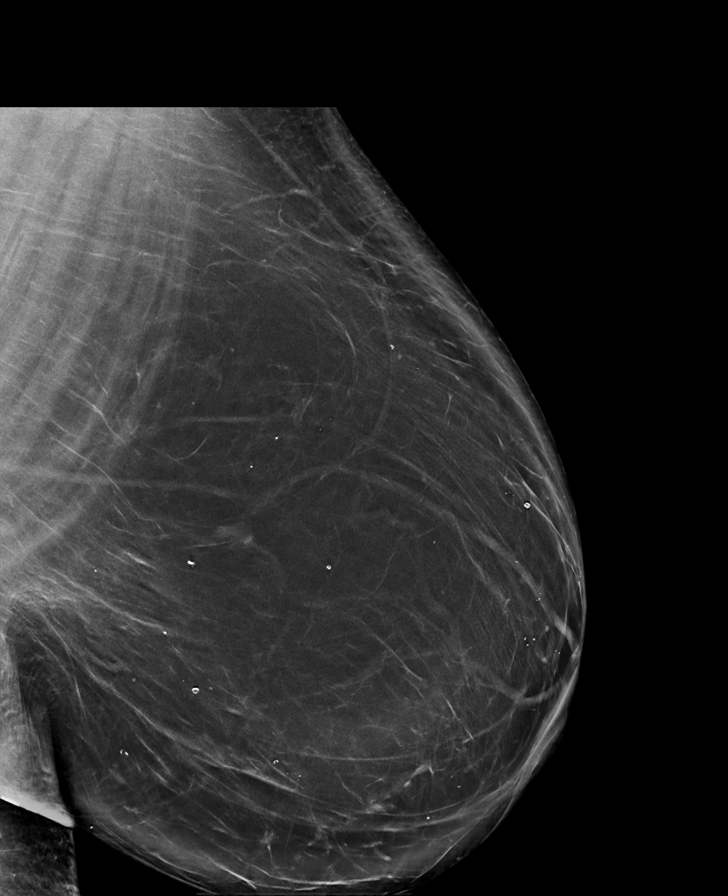

[L MLO synth-2D (2 of 2)]
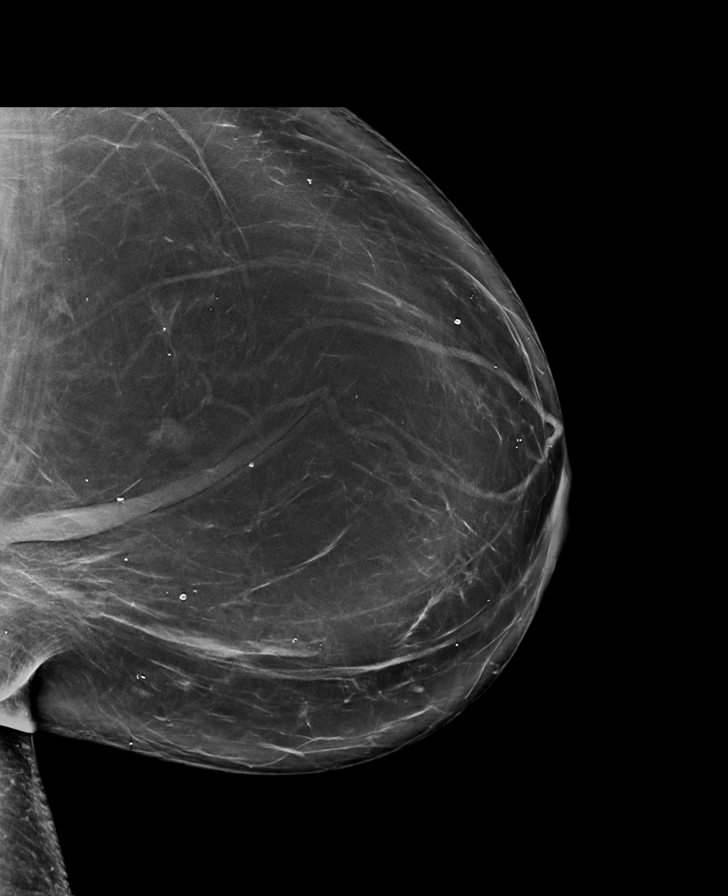

[L CC synth-2D (1 of 2)]
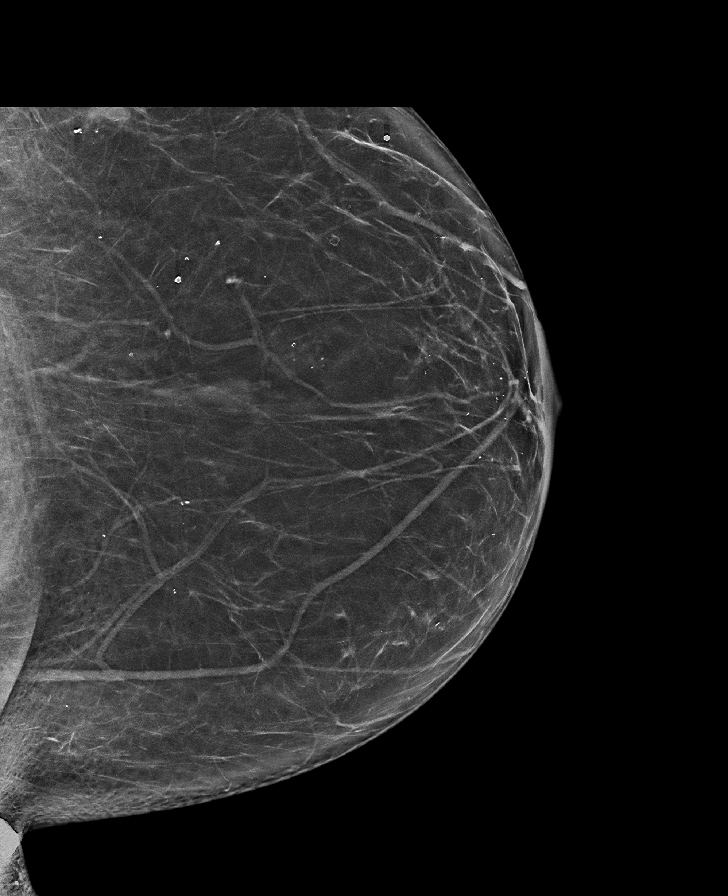

[R MLO synth-2D]
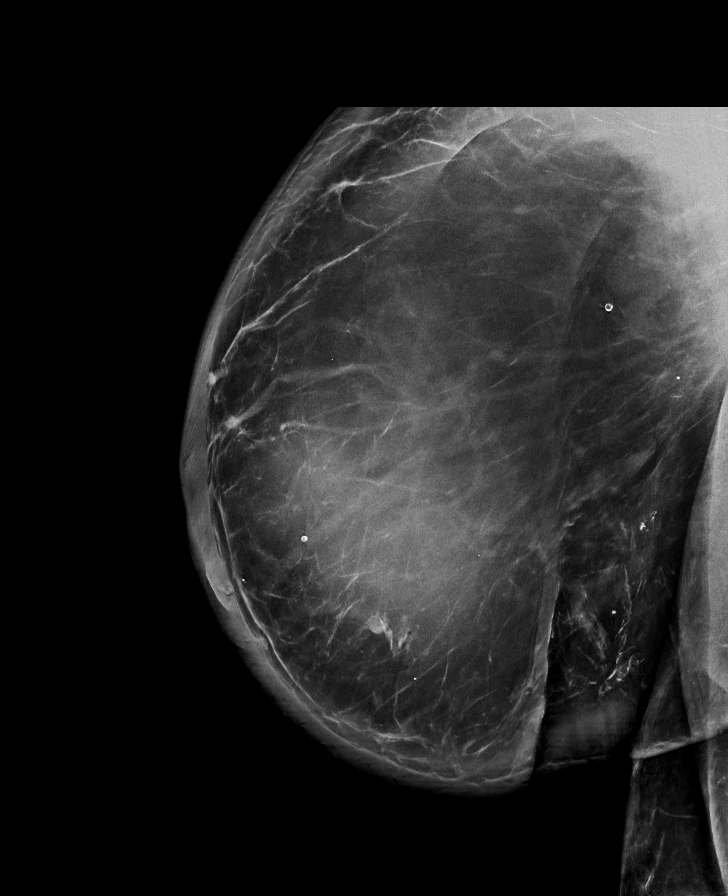

[R CC synth-2D]
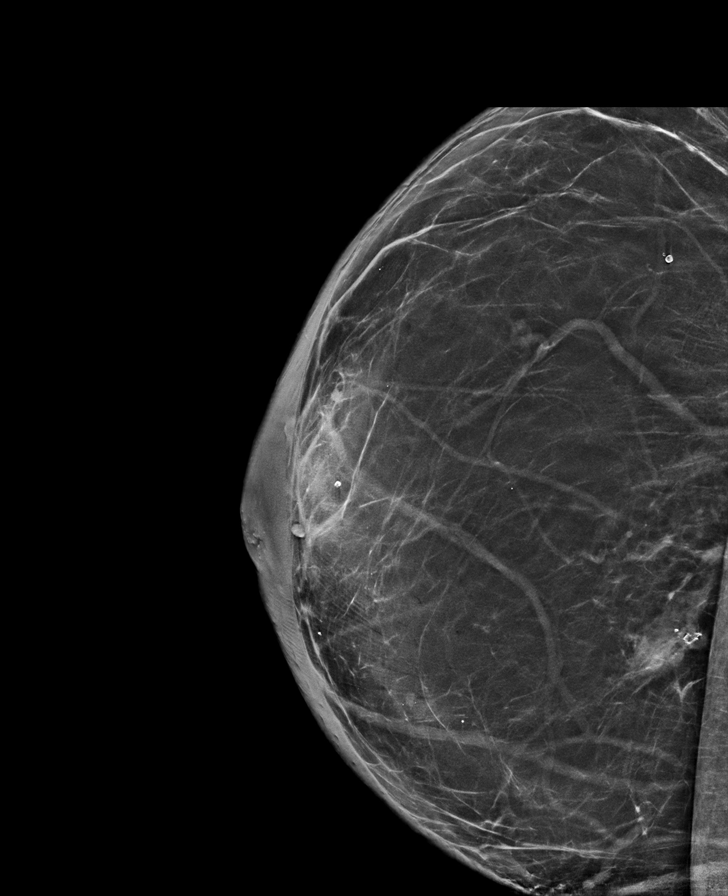

[L CC synth-2D (2 of 2)]
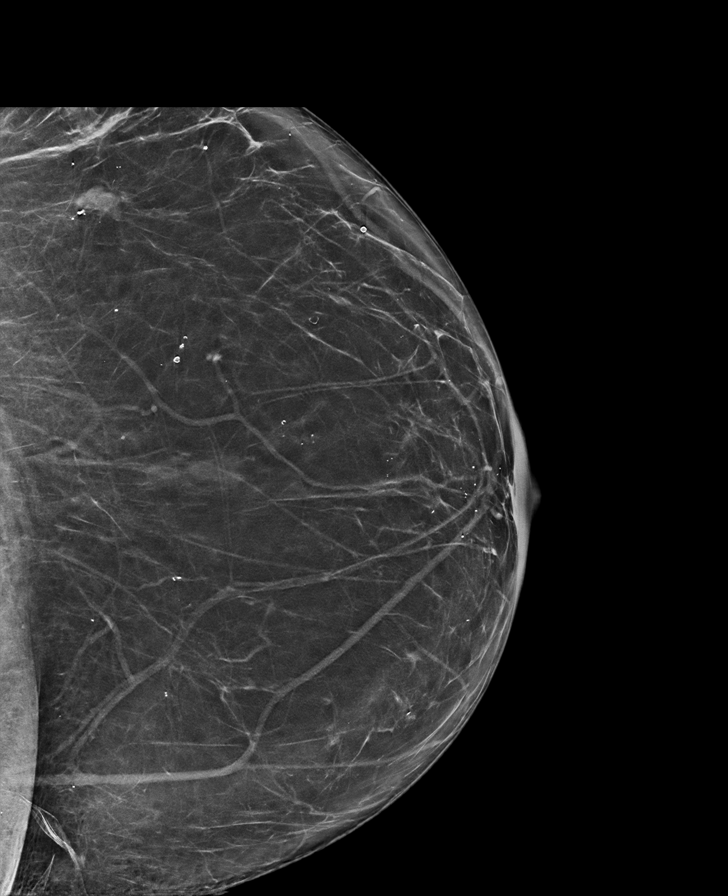

[L CC tomo · 2 of 82 frames shown]
[frame 27/82]
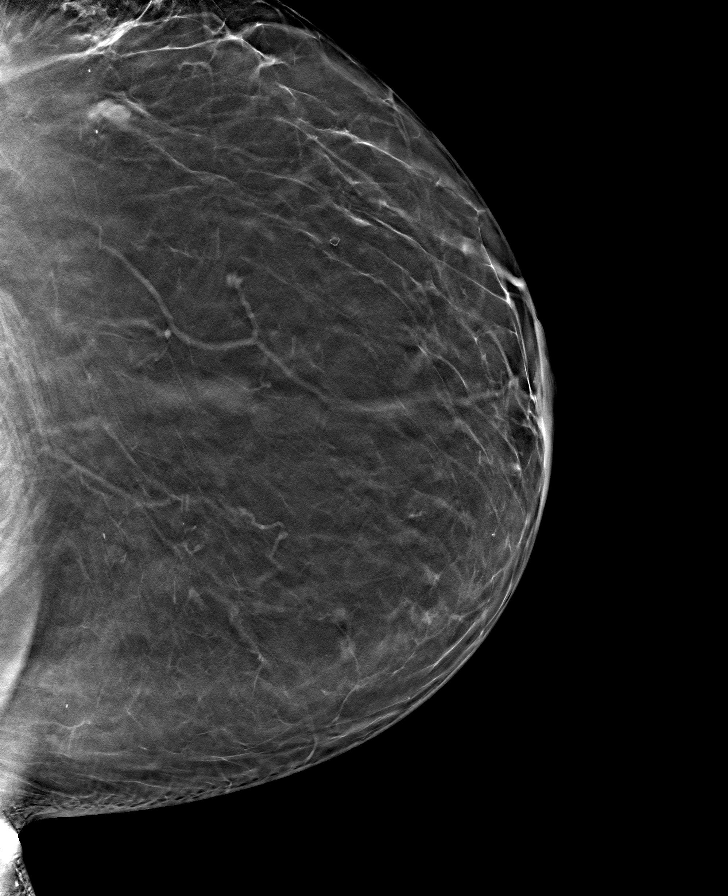
[frame 41/82]
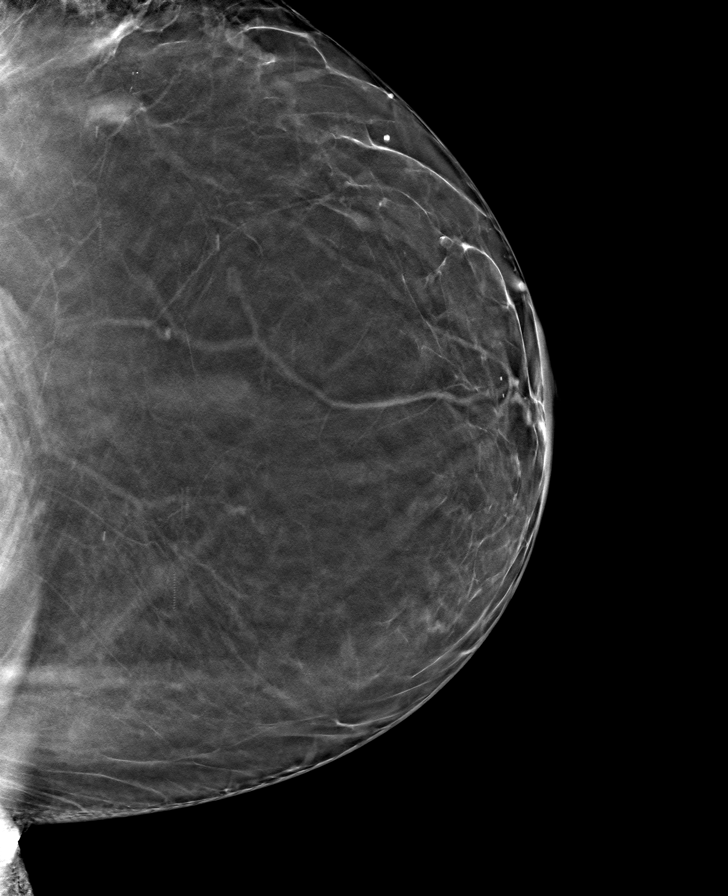

[8 of 36 positions shown; findings below may reference images not displayed]

ACR Breast Density Category b: There are scattered areas of
fibroglandular density.
FINDINGS: 2D/3D full field views of both breasts demonstrate mild diffuse
RIGHT breast skin thickening. Asymmetry within the LOWER RIGHT
breast identified.

A circumscribed oval mass in the central/LOWER LEFT breast is noted.

A probable intraparenchymal lymph node within the OUTER LEFT breast
is identified.

Reduction changes within both breasts are present.

On physical exam, moderate erythema overlying the central RIGHT
breast identified. A draining wound at the 6 o'clock position 2 cm
from the nipple is noted with some pus expressed.

Targeted ultrasound is performed, showing the following:

RIGHT breast:

An ill-defined area of fluid directly underlying the draining wound
is noted measuring up to 2.5 x 0.5 cm. No other focal collections
are identified.

LEFT breast:

A 1 x 0.4 x 1.1 cm circumscribed oval isoechoic to hypoechoic
parallel mass at the 4 o'clock position of the LEFT breast 5 cm from
the nipple. No other sonographic abnormalities identified within the
central or OUTER LEFT breast. No abnormal LEFT axillary lymph nodes
are identified.
IMPRESSION: 1. RIGHT breast cellulitis with ill-defined likely infected fluid
within the LOWER RIGHT breast, draining through a superficial wound.
Given connection to the skin and superficial drainage, aspiration
was not performed. The patient was given a prescription for Bactrim,
2 P.O. bid for 14 days. Follow-up physical examination and
ultrasound in 3-4 days is recommended.
2. Likely benign LEFT breast masses. Six-month follow-up recommended
to ensure stability.

RECOMMENDATION:
1. RIGHT breast ultrasound in 3-4 days.
2. LEFT mammogram and LEFT breast ultrasound in 6 months

I have discussed the findings and recommendations with the patient.
If applicable, a reminder letter will be sent to the patient
regarding the next appointment.

BI-RADS CATEGORY  3: Probably benign.

## 2022-01-16 ENCOUNTER — Other Ambulatory Visit: Payer: Self-pay | Admitting: Endocrinology

## 2022-01-16 ENCOUNTER — Encounter: Payer: Self-pay | Admitting: Nurse Practitioner

## 2022-01-16 ENCOUNTER — Other Ambulatory Visit (HOSPITAL_COMMUNITY): Payer: Self-pay

## 2022-01-16 DIAGNOSIS — E1165 Type 2 diabetes mellitus with hyperglycemia: Secondary | ICD-10-CM

## 2022-01-16 MED ORDER — LANTUS SOLOSTAR 100 UNIT/ML ~~LOC~~ SOPN
PEN_INJECTOR | SUBCUTANEOUS | 0 refills | Status: AC
  Filled 2022-01-16: qty 6, 30d supply, fill #0
  Filled 2022-02-09: qty 12, 60d supply, fill #0

## 2022-01-16 MED ORDER — OZEMPIC (0.25 OR 0.5 MG/DOSE) 2 MG/3ML ~~LOC~~ SOPN
PEN_INJECTOR | SUBCUTANEOUS | 1 refills | Status: AC
Start: 2022-01-16 — End: ?
  Filled 2022-01-16: qty 9, 84d supply, fill #0
  Filled 2022-02-26: qty 3, 28d supply, fill #0

## 2022-01-16 MED ORDER — FLUCONAZOLE 150 MG PO TABS
ORAL_TABLET | ORAL | 2 refills | Status: AC
  Filled 2022-01-16: qty 1, 1d supply, fill #0

## 2022-01-16 MED ORDER — HUMALOG KWIKPEN 200 UNIT/ML ~~LOC~~ SOPN
PEN_INJECTOR | SUBCUTANEOUS | 2 refills | Status: AC
Start: 2022-01-16 — End: ?
  Filled 2022-01-16: qty 6, 26d supply, fill #0

## 2022-01-16 MED ORDER — METFORMIN HCL 500 MG PO TABS
ORAL_TABLET | ORAL | 0 refills | Status: AC
  Filled 2022-01-16: qty 60, 30d supply, fill #0

## 2022-01-16 MED ORDER — CLINDAMYCIN HCL 300 MG PO CAPS
ORAL_CAPSULE | ORAL | 0 refills | Status: AC
  Filled 2022-01-16: qty 40, 10d supply, fill #0

## 2022-01-17 ENCOUNTER — Other Ambulatory Visit (HOSPITAL_COMMUNITY): Payer: Self-pay

## 2022-01-17 MED ORDER — INSULIN PEN NEEDLE 32G X 4 MM MISC
5 refills | Status: AC
  Filled 2022-01-17 – 2022-01-19 (×2): qty 100, 100d supply, fill #0

## 2022-01-19 ENCOUNTER — Other Ambulatory Visit (HOSPITAL_COMMUNITY): Payer: Self-pay

## 2022-01-20 ENCOUNTER — Other Ambulatory Visit (HOSPITAL_COMMUNITY): Payer: Self-pay

## 2022-01-20 MED ORDER — INSULIN GLARGINE-YFGN 100 UNIT/ML ~~LOC~~ SOPN
PEN_INJECTOR | SUBCUTANEOUS | 2 refills | Status: AC
  Filled 2022-01-20: qty 6, 30d supply, fill #0
  Filled 2022-02-09: qty 6, 30d supply, fill #1

## 2022-01-22 ENCOUNTER — Encounter: Payer: Self-pay | Admitting: Nurse Practitioner

## 2022-01-22 ENCOUNTER — Other Ambulatory Visit (HOSPITAL_COMMUNITY): Payer: Self-pay

## 2022-01-30 ENCOUNTER — Telehealth: Payer: Self-pay | Admitting: Endocrinology

## 2022-01-30 NOTE — Telephone Encounter (Signed)
[id number] to schedule appt - see refill request and MD note

## 2022-02-03 ENCOUNTER — Other Ambulatory Visit (HOSPITAL_COMMUNITY): Payer: Self-pay

## 2022-02-09 ENCOUNTER — Other Ambulatory Visit (HOSPITAL_COMMUNITY): Payer: Self-pay

## 2022-02-11 ENCOUNTER — Other Ambulatory Visit (HOSPITAL_COMMUNITY): Payer: Self-pay

## 2022-02-17 ENCOUNTER — Other Ambulatory Visit (HOSPITAL_COMMUNITY): Payer: Self-pay

## 2022-02-24 ENCOUNTER — Other Ambulatory Visit (HOSPITAL_COMMUNITY): Payer: Self-pay

## 2022-02-24 MED ORDER — AMLODIPINE BESYLATE 5 MG PO TABS
5.0000 mg | ORAL_TABLET | Freq: Every day | ORAL | 0 refills | Status: AC
  Filled 2022-02-24: qty 30, 30d supply, fill #0

## 2022-02-25 ENCOUNTER — Other Ambulatory Visit (HOSPITAL_COMMUNITY): Payer: Self-pay

## 2022-02-27 ENCOUNTER — Other Ambulatory Visit (HOSPITAL_COMMUNITY): Payer: Self-pay

## 2022-03-05 ENCOUNTER — Other Ambulatory Visit (HOSPITAL_COMMUNITY): Payer: Self-pay

## 2022-03-06 ENCOUNTER — Other Ambulatory Visit (HOSPITAL_COMMUNITY): Payer: Self-pay

## 2022-04-09 ENCOUNTER — Other Ambulatory Visit (HOSPITAL_COMMUNITY): Payer: Self-pay

## 2022-04-16 ENCOUNTER — Other Ambulatory Visit: Payer: 59

## 2022-04-20 ENCOUNTER — Encounter: Payer: Self-pay | Admitting: Nurse Practitioner

## 2022-04-20 ENCOUNTER — Ambulatory Visit: Payer: 59 | Admitting: Endocrinology

## 2022-04-25 ENCOUNTER — Other Ambulatory Visit (HOSPITAL_COMMUNITY): Payer: Self-pay

## 2022-07-09 ENCOUNTER — Encounter: Payer: Self-pay | Admitting: Nurse Practitioner

## 2022-07-11 ENCOUNTER — Encounter: Payer: Self-pay | Admitting: Nurse Practitioner
